# Patient Record
Sex: Female | Born: 1965 | Race: White | Hispanic: No | Marital: Single | Smoking: Current every day smoker
Health system: Southern US, Community
[De-identification: ages and names within clinical notes are randomized; demographics above are authoritative.]

## PROBLEM LIST (undated history)

## (undated) DIAGNOSIS — F32A Depression, unspecified: Secondary | ICD-10-CM

## (undated) DIAGNOSIS — F329 Major depressive disorder, single episode, unspecified: Secondary | ICD-10-CM

## (undated) DIAGNOSIS — J449 Chronic obstructive pulmonary disease, unspecified: Secondary | ICD-10-CM

---

## 1998-07-06 ENCOUNTER — Ambulatory Visit (HOSPITAL_COMMUNITY): Admission: RE | Admit: 1998-07-06 | Discharge: 1998-07-06 | Payer: Self-pay | Admitting: Gastroenterology

## 2002-08-04 ENCOUNTER — Ambulatory Visit (HOSPITAL_COMMUNITY): Admission: RE | Admit: 2002-08-04 | Discharge: 2002-08-04 | Payer: Self-pay | Admitting: Gastroenterology

## 2011-06-30 ENCOUNTER — Inpatient Hospital Stay (HOSPITAL_COMMUNITY)
Admission: EM | Admit: 2011-06-30 | Discharge: 2011-07-01 | DRG: 603 | Disposition: A | Discharge status: Home / Self Care | Payer: Self-pay | Attending: Internal Medicine | Admitting: Internal Medicine

## 2011-06-30 DIAGNOSIS — IMO0002 Reserved for concepts with insufficient information to code with codable children: Principal | ICD-10-CM | POA: Diagnosis present

## 2011-06-30 DIAGNOSIS — F3289 Other specified depressive episodes: Secondary | ICD-10-CM | POA: Diagnosis present

## 2011-06-30 DIAGNOSIS — R51 Headache: Secondary | ICD-10-CM | POA: Diagnosis present

## 2011-06-30 DIAGNOSIS — E876 Hypokalemia: Secondary | ICD-10-CM | POA: Diagnosis present

## 2011-06-30 DIAGNOSIS — F329 Major depressive disorder, single episode, unspecified: Secondary | ICD-10-CM | POA: Diagnosis present

## 2011-06-30 DIAGNOSIS — D72829 Elevated white blood cell count, unspecified: Secondary | ICD-10-CM | POA: Diagnosis present

## 2011-06-30 DIAGNOSIS — F17200 Nicotine dependence, unspecified, uncomplicated: Secondary | ICD-10-CM | POA: Diagnosis present

## 2011-06-30 DIAGNOSIS — IMO0001 Reserved for inherently not codable concepts without codable children: Secondary | ICD-10-CM | POA: Diagnosis present

## 2011-07-01 LAB — COMPREHENSIVE METABOLIC PANEL
ALT: <5 U/L (ref 0–35)
AST: 6 U/L (ref 0–37)
Calcium: 8.2 mg/dL — ABNORMAL LOW (ref 8.4–10.5)
Creatinine, Ser: 0.55 mg/dL (ref 0.50–1.10)
GFR calc Af Amer: >60 mL/min (ref >60)
Sodium: 138 mEq/L (ref 135–145)
Total Protein: 5.6 g/dL — ABNORMAL LOW (ref 6.0–8.3)

## 2011-07-01 LAB — POCT I-STAT, CHEM 8
BUN: 8 mg/dL (ref 6–23)
Chloride: 102 mEq/L (ref 96–112)
Creatinine, Ser: 0.7 mg/dL (ref 0.50–1.10)
Potassium: 2.7 mEq/L — CL (ref 3.5–5.1)
Sodium: 140 mEq/L (ref 135–145)

## 2011-07-01 LAB — RAPID URINE DRUG SCREEN, HOSP PERFORMED
Amphetamines: NOT DETECTED
Benzodiazepines: NOT DETECTED
Cocaine: NOT DETECTED
Opiates: NOT DETECTED
Tetrahydrocannabinol: NOT DETECTED

## 2011-07-01 LAB — DIFFERENTIAL
Basophils Relative: 0 % (ref 0–1)
Monocytes Relative: 7 % (ref 3–12)
Neutro Abs: 14.9 10*3/uL — ABNORMAL HIGH (ref 1.7–7.7)
Neutrophils Relative %: 80 % — ABNORMAL HIGH (ref 43–77)

## 2011-07-01 LAB — CBC
Hemoglobin: 13.7 g/dL (ref 12.0–15.0)
MCH: 32.5 pg (ref 26.0–34.0)
MCH: 32.5 pg (ref 26.0–34.0)
MCHC: 34.1 g/dL (ref 30.0–36.0)
Platelets: 176 10*3/uL (ref 150–400)
RBC: 4.21 MIL/uL (ref 3.87–5.11)
WBC: 18.6 10*3/uL — ABNORMAL HIGH (ref 4.0–10.5)

## 2011-07-02 NOTE — Discharge Summary (Signed)
NAMEJAMICE, CARRENO NO.:  [id number]  MEDICAL RECORD NO.:  [id number]  LOCATION:  5114                         FACILITY:  [facility]  PHYSICIAN:  Andreas Blower, MD       DATE OF BIRTH:  [birth year]  DATE OF ADMISSION:  06/30/2011 DATE OF DISCHARGE:  07/01/2011                              DISCHARGE SUMMARY   PRIMARY CARE PHYSICIAN:  The patient reports that she sees Dr. Lanny Cramp.  DISCHARGE DIAGNOSES: 1. Cellulitis of her right upper extremity from her palm to the elbow     after a cat bite, improved. 2. Tobacco abuse. 3. History of depression. 4. Hypokalemia. 5. Leukocytosis. 6. History of chronic intermittent headaches.  DISCHARGE MEDICATIONS: 1. Augmentin 875 mg p.o. twice daily for 14 days. 2. Calcium over-the-counter one tablet p.o. daily. 3. Lexapro 10 mg daily. 4. Vitamin C over-the-counter one tablet p.o. daily.  BRIEF ADMITTING HISTORY AND PHYSICAL:  Mr. Zylka is a 45 year old Caucasian female with history of depression, tobacco use, who came in after she had a cat bite few days ago with increased swelling and redness in her arm.  RADIOLOGY/IMAGING:  The patient has no radiology imaging.  LABORATORY DATA:  CBC shows a white count of 14.3, hemoglobin 11.4, hematocrit 34.0, platelet count 176.  Electrolytes normal except potassium is 2.0, BUN is 8, creatinine 0.55.  Liver function tests normal except albumin is 2.8, total protein is 5.6.  Urine drug screen negative.  Blood cultures x2 showed no growth to date.  ASSESSMENT AND PLAN: 1. Right upper extremity cellulitis from her palm to the elbow after a     cat bite.  The patient initially was started on Unasyn in the ER;     however, the antibiotics were transitioned to vancomycin and Zosyn.     After IV antibiotics were started, the patient's redness which had     extended in all the way up to the elbow was resolved.  Her     antibiotics were transitioned to Augmentin given the patient  has     reports cat bite.  The patient will take Augmentin for 14 days.  On     discussing with the patient and family, it appears that the cat has     been well taking care of and was unintentional provoked accident     with the bite.  The patient was also instructed that if she starts     having swelling and increasing redness, she is to come back to the     ER for further evaluation. 2. Tobacco abuse.  Again, instructed the patient on smoking cessation;     however, she indicated that she is not interested and would     continue to smoke. 3. Depression, stable.  Continue the patient on home medications at     the time of discharge. 4. Hypokalemia.  The patient was replaced in the ER. 5. Headaches.  The patient was instructed to continue a [employer]     that she has been taking as needed, instructor to take Tylenol if     the patient continues the headache.  She was instructed to follow  with her primary care physician for further evaluation if she has     persistent chronic headaches. 6. Leukocytosis likely due to cellulitis.  Time spent on discharge talking to the patient's family and coordinating care was 25 minutes.  DISPOSITION AND FOLLOWUP:  The patient was instructed to follow up with Dr. Loleta Chance, her primary care physician in 1 week as needed.   Andreas Blower, MD   SR/MEDQ  D:  07/01/2011  T:  07/01/2011  Job:  [id number]  Electronically Signed by Wardell Heath Lucindia Lemley  on 07/02/2011 08:58:02 AM

## 2011-07-07 LAB — CULTURE, BLOOD (ROUTINE X 2)
Culture  Setup Time: 201208140838
Culture: NO GROWTH

## 2011-08-30 NOTE — H&P (Signed)
Rebecca Liu, Rebecca Liu NO.:  [id number]  MEDICAL RECORD NO.:  [id number]  LOCATION:  [facility]                         FACILITY:  [facility]  PHYSICIAN:  Lonia Blood, M.D.      DATE OF BIRTH:  [birth year]  DATE OF ADMISSION:  07/01/2011 DATE OF DISCHARGE:                             HISTORY & PHYSICAL   PRIMARY CARE PHYSICIAN:  She is unassigned to [place].  PRESENTING COMPLAINT:  Right upper extremity swelling and tenderness.  HISTORY OF PRESENT ILLNESS:  The patient is a 45 year old female with history of mainly of depression who came in secondary to a cat bite that happened a few days ago.  Since then, she has had pain in her hand.  She was bit last Sunday on the right hand.  The hand started swelling, but today it extended all the way up the arm.  She has some vomiting and chills.  She decided to come in for further treatment.  She also had fever at home.  No other site was involved.  She is not sure weather thecart was immunized or not.  The patient has had tetanus shot during the last 10 years.  PAST MEDICAL HISTORY:  Significant mainly for depression.  ALLERGIES:  No known drug allergies.  MEDICATIONS:  She takes one medication for depression, she cannot recall.  SOCIAL HISTORY:  [place]  She smokes about one and half packs per days.  No alcohol.  No IV drug use.  FAMILY HISTORY:  No significant family history for hypertension, sepsis, or any immunocompromised status.  REVIEW OF SYSTEMS:  All systems reviewed are negative except per HPI.  PHYSICAL EXAMINATION:  VITAL SIGNS:  Temperature here 98.8 T-max, blood pressure 138/74 with pulse 93, respiratory rate 16, and sats 97% on room air. GENERAL:  She is awake, alert, oriented, pleasant woman who is in no acute distress. HEENT:  PERRL.  EOMI.  No pallor, no jaundice, no rhinorrhea. NECK:  Supple.  No JVD, no lymphadenopathy. RESPIRATORY:  Good air entry bilaterally.  No wheezes,  no rales, no crackles. CARDIOVASCULAR:  S1 and S2.  No audible murmur. ABDOMEN:  Soft, full, nontender with positive bowel sounds. EXTREMITIES:  No edema, cyanosis, or clubbing in the lower extremity. Right upper extremity shows 2 distinct bite marks on the volar aspect of her palm, just below her thumb.  There is also a medial area of erythema and swelling with warmth extending all the way to her axilla.  No discharge is noted and no ulcers noted. SKIN:  As described above. MUSCULOSKELETAL:  No joint swelling or tenderness.  LABORATORY DATA:  White count is 18.6, her hemoglobin 13.7 with platelet of 215.  She has left shift, ANC of 14.9.  Sodium 140, potassium is 2.7, chloride 102, BUN 8, creatinine 0.70, glucose 101, ionized calcium 1.17.  ASSESSMENT:  This is a 45 year old female presenting with cellulitis of her right upper extremity after a cat bite.  She also has hypokalemia probably from the nausea and vomiting she experienced.  She is not septic at this point.  PLAN: 1. Cellulitis:  We will admit the patient for IV antibiotics and also  put as observation.  I will start on vancomycin and Zosyn until she     gets better, then put her on oral probably doxycycline or Septra.     Being that this is cat bite, we need to cover for anaerobic as     well.  That is why we will get her Zosyn.  When she leaves, maybe     clindamycin will be a good choice.  The pain control also be     instituted. 2. Hypokalemia from her nausea and vomiting:  We will replete her     potassium. 3. Tobacco abuse:  I will put her on some nicotine patch and also     tobacco cessation counseling. 4. Depression.  We will obtain her home medication list in the morning     and start her on that.  Further treatment depends on how she     responds to these measures.     Lonia Blood, M.D.     Verlin Grills  D:  07/01/2011  T:  07/01/2011  Job:  [id number]  Electronically Signed by Lonia Blood M.D. on  08/30/2011 02:52:16 PM

## 2011-11-23 ENCOUNTER — Emergency Department (HOSPITAL_COMMUNITY): Payer: Self-pay

## 2011-11-23 ENCOUNTER — Encounter: Payer: Self-pay | Admitting: *Deleted

## 2011-11-23 ENCOUNTER — Emergency Department (HOSPITAL_COMMUNITY)
Admission: EM | Admit: 2011-11-23 | Discharge: 2011-11-23 | Disposition: A | Discharge status: Home / Self Care | Payer: Self-pay | Attending: Emergency Medicine | Admitting: Emergency Medicine

## 2011-11-23 DIAGNOSIS — R079 Chest pain, unspecified: Secondary | ICD-10-CM | POA: Insufficient documentation

## 2011-11-23 DIAGNOSIS — R062 Wheezing: Secondary | ICD-10-CM | POA: Insufficient documentation

## 2011-11-23 DIAGNOSIS — R05 Cough: Secondary | ICD-10-CM | POA: Insufficient documentation

## 2011-11-23 DIAGNOSIS — M546 Pain in thoracic spine: Secondary | ICD-10-CM | POA: Insufficient documentation

## 2011-11-23 DIAGNOSIS — J189 Pneumonia, unspecified organism: Secondary | ICD-10-CM | POA: Insufficient documentation

## 2011-11-23 DIAGNOSIS — R109 Unspecified abdominal pain: Secondary | ICD-10-CM | POA: Insufficient documentation

## 2011-11-23 DIAGNOSIS — R059 Cough, unspecified: Secondary | ICD-10-CM | POA: Insufficient documentation

## 2011-11-23 DIAGNOSIS — Z79899 Other long term (current) drug therapy: Secondary | ICD-10-CM | POA: Insufficient documentation

## 2011-11-23 DIAGNOSIS — R0989 Other specified symptoms and signs involving the circulatory and respiratory systems: Secondary | ICD-10-CM | POA: Insufficient documentation

## 2011-11-23 LAB — D-DIMER, QUANTITATIVE: D-Dimer, Quant: 0.54 ug/mL-FEU — ABNORMAL HIGH (ref 0.00–0.48)

## 2011-11-23 LAB — POCT I-STAT, CHEM 8
BUN: 3 mg/dL — ABNORMAL LOW (ref 6–23)
Calcium, Ion: 1.13 mmol/L (ref 1.12–1.32)
Chloride: 103 mEq/L (ref 96–112)
HCT: 48 % — ABNORMAL HIGH (ref 36.0–46.0)
Potassium: 3.9 mEq/L (ref 3.5–5.1)

## 2011-11-23 MED ORDER — SODIUM CHLORIDE 0.9 % IV SOLN
INTRAVENOUS | Status: DC
  Administered 2011-11-23: 17:00:00 via INTRAVENOUS

## 2011-11-23 MED ORDER — OXYCODONE-ACETAMINOPHEN 5-325 MG PO TABS: 2.0000 | ORAL_TABLET | ORAL | Status: AC | PRN

## 2011-11-23 MED ORDER — PREDNISONE 20 MG PO TABS
60.0000 mg | ORAL_TABLET | Freq: Once | ORAL | Status: AC
  Administered 2011-11-23: 60 mg via ORAL
  Filled 2011-11-23: qty 3

## 2011-11-23 MED ORDER — KETOROLAC TROMETHAMINE 30 MG/ML IJ SOLN
30.0000 mg | Freq: Once | INTRAMUSCULAR | Status: AC
  Administered 2011-11-23: 30 mg via INTRAVENOUS
  Filled 2011-11-23: qty 1

## 2011-11-23 MED ORDER — IOHEXOL 350 MG/ML SOLN
100.0000 mL | Freq: Once | INTRAVENOUS | Status: AC | PRN
  Administered 2011-11-23: 100 mL via INTRAVENOUS

## 2011-11-23 MED ORDER — ALBUTEROL SULFATE HFA 108 (90 BASE) MCG/ACT IN AERS
2.0000 | INHALATION_SPRAY | RESPIRATORY_TRACT | Status: DC | PRN
  Administered 2011-11-23: 2 via RESPIRATORY_TRACT
  Filled 2011-11-23: qty 6.7

## 2011-11-23 MED ORDER — LEVOFLOXACIN 500 MG PO TABS: 500.0000 mg | ORAL_TABLET | Freq: Every day | ORAL | Status: AC

## 2011-11-23 NOTE — ED Notes (Signed)
She has had this for 4 months.  Hoarse cough and she is a smoker.

## 2011-11-23 NOTE — ED Provider Notes (Signed)
History     CSN: [id number]  Arrival date & time 11/23/11  1524   First MD Initiated Contact with Patient 11/23/11 1610      Chief Complaint  Patient presents with  . Rib Injury    (Consider location/radiation/quality/duration/timing/severity/associated sxs/prior treatment) The history is provided by the patient and the spouse.   the patient is a 46 year old, female, with no significant past medical history, who smokes cigarettes.  She presents to the emergency department complaining of right sided pleuritic posterior back pain.  This has been present for about 10 days.  She was seen by another doctor and told that she had pleurisy and placed on multiple medications.  She has not gotten better, so she presented to the emergency department for further evaluation.  She denies cough, shortness of breath, chest pain, nausea, vomiting, fevers, chills, leg pain or swelling.  History reviewed. No pertinent past medical history.  History reviewed. No pertinent past surgical history.  History reviewed. No pertinent family history.  History  Substance Use Topics  . Smoking status: Current Everyday Smoker  . Smokeless tobacco: Not on file  . Alcohol Use: No    OB History    Grav Para Term Preterm Abortions TAB SAB Ect Mult Living                  Review of Systems  Constitutional: Negative for fever and chills.  HENT: Negative for congestion.   Respiratory: Positive for wheezing. Negative for cough and shortness of breath.   Cardiovascular: Negative for chest pain.  Gastrointestinal: Negative for nausea and vomiting.  Genitourinary: Positive for flank pain. Negative for hematuria.  Musculoskeletal: Positive for back pain.  Skin: Negative for rash.  All other systems reviewed and are negative.    Allergies  Review of patient's allergies indicates no known allergies.  Home Medications   Current Outpatient Rx  Name Route Sig Dispense Refill  . BENZONATATE 100 MG PO CAPS Oral  Take 100 mg by mouth 3 (three) times daily as needed. For cough     . CLARITHROMYCIN 500 MG PO TABS Oral Take 500 mg by mouth 2 (two) times daily. For ten days starting 11/14/11     . ESCITALOPRAM OXALATE 10 MG PO TABS Oral Take 5 mg by mouth at bedtime.      . GUAIFENESIN-CODEINE 100-10 MG/5ML PO SYRP Oral Take 5-10 mLs by mouth every 4 (four) hours as needed. For cough     . MELOXICAM 15 MG PO TABS Oral Take 15 mg by mouth daily.      Marland Kitchen PREDNISONE 10 MG PO TABS Oral Take 10 mg by mouth daily. Started on tapered dose starting 11/14/11, currently on one tablet daily       BP 133/98  Pulse 97  Temp(Src) 98.6 F (37 C) (Oral)  Resp 20  SpO2 94%  Physical Exam  Constitutional: She is oriented to person, place, and time. She appears well-developed and well-nourished.  HENT:  Head: Normocephalic and atraumatic.  Eyes: Pupils are equal, round, and reactive to light.  Neck: Normal range of motion.  Cardiovascular: Normal rate, regular rhythm and normal heart sounds.   No murmur heard. Pulmonary/Chest: Effort normal. No respiratory distress. She has wheezes. She has no rales.  Abdominal: Soft. She exhibits no distension and no mass. There is no tenderness. There is no rebound and no guarding.  Musculoskeletal: Normal range of motion. She exhibits no edema and no tenderness.       Tenderness  in the right posterior back, just inferior and medial to the right scapula.  No rash, ecchymoses, or signs of trauma or infection  Neurological: She is alert and oriented to person, place, and time. No cranial nerve deficit.  Skin: Skin is warm and dry. No rash noted. No erythema.  Psychiatric: She has a normal mood and affect. Her behavior is normal.    ED Course  Procedures (including critical care time) 46 year old, female, with pleuritic chest pain for approximately 10 days.  She has no risk factors for a pulmonary embolism, but she has no symptoms consistent with pneumonia.  We will perform a chest  x-ray, and laboratory testing, including a d-dimer, to look for signs of a pulmonary embolism.  Labs Reviewed  D-DIMER, QUANTITATIVE - Abnormal; Notable for the following:    D-Dimer, Quant 0.54 (*)    All other components within normal limits  POCT I-STAT, CHEM 8 - Abnormal; Notable for the following:    BUN 3 (*)    Glucose, Bld 102 (*)    Hemoglobin 16.3 (*)    HCT 48.0 (*)    All other components within normal limits  I-STAT, CHEM 8   Dg Chest 2 View  11/23/2011  *RADIOLOGY REPORT*  Clinical Data: Cough, congestion  CHEST - 2 VIEW  Comparison: None.  Findings: Chronic interstitial markings.  Mild superimposed patchy opacity in the right upper lobe, possibly infectious. No pleural effusion or pneumothorax.  Cardiomediastinal silhouette is within normal limits.  Degenerative changes of the visualized thoracolumbar spine.  IMPRESSION: Chronic interstitial markings.  Mild superimposed patchy opacity in the right upper lobe, possibly infectious.  Original Report Authenticated By: Charline Bills, M.D.     No diagnosis found.    MDM  Suspected persistent pneumonia No hypoxia or respiratory distress No pulmonary embolism        Nicholes Stairs, MD 11/23/11 2147

## 2011-11-23 NOTE — ED Notes (Signed)
C/o rt sided lower lateral rib pain for 10 days.  She has been seen at the urgent care x 4 for the same and she says they told her they could not do anything else for here and sent her down.  She has been diagnosed with pleurisy

## 2011-11-23 NOTE — ED Notes (Signed)
Patient transported to X-ray 

## 2011-11-23 NOTE — ED Notes (Signed)
Pt c/o sob, productive cough w/yellow colored sputum, and rib pain d/t coughing. Pt seen at [facility] x4 for the same and prescribed numerous abx w/o any relief, they sent pt to ed today for further evaluation.

## 2015-02-23 ENCOUNTER — Emergency Department (HOSPITAL_COMMUNITY): Payer: Self-pay

## 2015-02-23 ENCOUNTER — Encounter (HOSPITAL_COMMUNITY): Payer: Self-pay | Admitting: *Deleted

## 2015-02-23 ENCOUNTER — Emergency Department (HOSPITAL_COMMUNITY)
Admission: EM | Admit: 2015-02-23 | Discharge: 2015-02-23 | Disposition: A | Discharge status: Home / Self Care | Payer: Self-pay | Attending: Emergency Medicine | Admitting: Emergency Medicine

## 2015-02-23 DIAGNOSIS — R531 Weakness: Secondary | ICD-10-CM | POA: Insufficient documentation

## 2015-02-23 DIAGNOSIS — R5383 Other fatigue: Secondary | ICD-10-CM | POA: Insufficient documentation

## 2015-02-23 DIAGNOSIS — Z7952 Long term (current) use of systemic steroids: Secondary | ICD-10-CM | POA: Insufficient documentation

## 2015-02-23 DIAGNOSIS — Z79899 Other long term (current) drug therapy: Secondary | ICD-10-CM | POA: Insufficient documentation

## 2015-02-23 DIAGNOSIS — Z720 Tobacco use: Secondary | ICD-10-CM | POA: Insufficient documentation

## 2015-02-23 DIAGNOSIS — G8929 Other chronic pain: Secondary | ICD-10-CM

## 2015-02-23 DIAGNOSIS — J441 Chronic obstructive pulmonary disease with (acute) exacerbation: Secondary | ICD-10-CM | POA: Insufficient documentation

## 2015-02-23 DIAGNOSIS — Z791 Long term (current) use of non-steroidal anti-inflammatories (NSAID): Secondary | ICD-10-CM | POA: Insufficient documentation

## 2015-02-23 DIAGNOSIS — R0602 Shortness of breath: Secondary | ICD-10-CM

## 2015-02-23 DIAGNOSIS — R51 Headache: Secondary | ICD-10-CM | POA: Insufficient documentation

## 2015-02-23 DIAGNOSIS — R5382 Chronic fatigue, unspecified: Secondary | ICD-10-CM

## 2015-02-23 HISTORY — DX: Major depressive disorder, single episode, unspecified: F32.9

## 2015-02-23 HISTORY — DX: Depression, unspecified: F32.A

## 2015-02-23 HISTORY — DX: Chronic obstructive pulmonary disease, unspecified: J44.9

## 2015-02-23 LAB — BASIC METABOLIC PANEL
Anion gap: 9 (ref 5–15)
BUN: 15 mg/dL (ref 6–23)
CALCIUM: 9.2 mg/dL (ref 8.4–10.5)
CO2: 26 mmol/L (ref 19–32)
CREATININE: 0.8 mg/dL (ref 0.50–1.10)
Chloride: 108 mmol/L (ref 96–112)
GFR calc Af Amer: >90 mL/min (ref >90)
GFR calc non Af Amer: 86 mL/min — ABNORMAL LOW (ref >90)
GLUCOSE: 109 mg/dL — AB (ref 70–99)
Potassium: 3.9 mmol/L (ref 3.5–5.1)
SODIUM: 143 mmol/L (ref 135–145)

## 2015-02-23 LAB — I-STAT TROPONIN, ED: TROPONIN I, POC: 0 ng/mL (ref 0.00–0.08)

## 2015-02-23 LAB — CBC
HEMATOCRIT: 41 % (ref 36.0–46.0)
Hemoglobin: 13.6 g/dL (ref 12.0–15.0)
MCH: 32.1 pg (ref 26.0–34.0)
MCHC: 33.2 g/dL (ref 30.0–36.0)
MCV: 96.7 fL (ref 78.0–100.0)
Platelets: 261 10*3/uL (ref 150–400)
RBC: 4.24 MIL/uL (ref 3.87–5.11)
RDW: 14.7 % (ref 11.5–15.5)
WBC: 5.2 10*3/uL (ref 4.0–10.5)

## 2015-02-23 MED ORDER — PREDNISONE 20 MG PO TABS: ORAL_TABLET | ORAL | Status: DC

## 2015-02-23 MED ORDER — IPRATROPIUM-ALBUTEROL 0.5-2.5 (3) MG/3ML IN SOLN
3.0000 mL | RESPIRATORY_TRACT | Status: DC
  Administered 2015-02-23: 3 mL via RESPIRATORY_TRACT
  Filled 2015-02-23: qty 3

## 2015-02-23 MED ORDER — PREDNISONE 20 MG PO TABS
60.0000 mg | ORAL_TABLET | Freq: Once | ORAL | Status: AC
  Administered 2015-02-23: 60 mg via ORAL
  Filled 2015-02-23: qty 3

## 2015-02-23 MED ORDER — AZITHROMYCIN 250 MG PO TABS: ORAL_TABLET | ORAL | Status: DC

## 2015-02-23 NOTE — ED Notes (Signed)
Pt reports having sob x 2 days with non productive cough. Also reports blurred vision for extended amount of time and "shaking" of her hands which is making it difficult to grip objects.

## 2015-02-23 NOTE — Discharge Instructions (Signed)
Continue your home inhalers. Use prednisone as directed. Take antibiotic until finished. Follow up with the neurologist listed above to have ongoing care of your unsteady gait and weakness. Follow up with your regular doctor in 1 week for recheck of symptoms. Return to the ER for changes or worsening symptoms.   Chronic Obstructive Pulmonary Disease Exacerbation Chronic obstructive pulmonary disease (COPD) is a common lung condition in which airflow from the lungs is limited. COPD is a general term that can be used to describe many different lung problems that limit airflow, including chronic bronchitis and emphysema. COPD exacerbations are episodes when breathing symptoms become much worse and require extra treatment. Without treatment, COPD exacerbations can be life threatening, and frequent COPD exacerbations can cause further damage to your lungs. CAUSES   Respiratory infections.   Exposure to smoke.   Exposure to air pollution, chemical fumes, or dust. Sometimes there is no apparent cause or trigger. RISK FACTORS  Smoking cigarettes.  Older age.  Frequent prior COPD exacerbations. SIGNS AND SYMPTOMS   Increased coughing.   Increased thick spit (sputum) production.   Increased wheezing.   Increased shortness of breath.   Rapid breathing.   Chest tightness. DIAGNOSIS  Your medical history, a physical exam, and tests will help your health care provider make a diagnosis. Tests may include:  A chest X-ray.  Basic lab tests.  Sputum testing.  An arterial blood gas test. TREATMENT  Depending on the severity of your COPD exacerbation, you may need to be admitted to a hospital for treatment. Some of the treatments commonly used to treat COPD exacerbations are:   Antibiotic medicines.   Bronchodilators. These are drugs that expand the air passages. They may be given with an inhaler or nebulizer. Spacer devices may be needed to help improve drug  delivery.  Corticosteroid medicines.  Supplemental oxygen therapy.  HOME CARE INSTRUCTIONS   Do not smoke. Quitting smoking is very important to prevent COPD from getting worse and exacerbations from happening as often.  Avoid exposure to all substances that irritate the airway, especially to tobacco smoke.   If you were prescribed an antibiotic medicine, finish it all even if you start to feel better.  Take all medicines as directed by your health care provider.It is important to use correct technique with inhaled medicines.  Drink enough fluids to keep your urine clear or pale yellow (unless you have a medical condition that requires fluid restriction).  Use a cool mist vaporizer. This makes it easier to clear your chest when you cough.   If you have a home nebulizer and oxygen, continue to use them as directed.   Maintain all necessary vaccinations to prevent infections.   Exercise regularly.   Eat a healthy diet.   Keep all follow-up appointments as directed by your health care provider. SEEK IMMEDIATE MEDICAL CARE IF:  You have worsening shortness of breath.   You have trouble talking.   You have severe chest pain.  You have blood in your sputum.  You have a fever.  You have weakness, vomit repeatedly, or faint.   You feel confused.   You continue to get worse. MAKE SURE YOU:   Understand these instructions.  Will watch your condition.  Will get help right away if you are not doing well or get worse. Document Released: 08/31/2007 Document Revised: 03/20/2014 Document Reviewed: 07/08/2013 [facility] Patient Information 2015 [place], [place]. This information is not intended to replace advice given to you by your health care provider.  Make sure you discuss any questions you have with your health care provider.  Fatigue Fatigue is a feeling of tiredness, lack of energy, lack of motivation, or feeling tired all the time. Having enough rest, good  nutrition, and reducing stress will normally reduce fatigue. Consult your caregiver if it persists. The nature of your fatigue will help your caregiver to find out its cause. The treatment is based on the cause.  CAUSES  There are many causes for fatigue. Most of the time, fatigue can be traced to one or more of your habits or routines. Most causes fit into one or more of three general areas. They are: Lifestyle problems  Sleep disturbances.  Overwork.  Physical exertion.  Unhealthy habits.  Poor eating habits or eating disorders.  Alcohol and/or drug use .  Lack of proper nutrition (malnutrition). Psychological problems  Stress and/or anxiety problems.  Depression.  Grief.  Boredom. Medical Problems or Conditions  Anemia.  Pregnancy.  Thyroid gland problems.  Recovery from major surgery.  Continuous pain.  Emphysema or asthma that is not well controlled  Allergic conditions.  Diabetes.  Infections (such as mononucleosis).  Obesity.  Sleep disorders, such as sleep apnea.  Heart failure or other heart-related problems.  Cancer.  Kidney disease.  Liver disease.  Effects of certain medicines such as antihistamines, cough and cold remedies, prescription pain medicines, heart and blood pressure medicines, drugs used for treatment of cancer, and some antidepressants. SYMPTOMS  The symptoms of fatigue include:   Lack of energy.  Lack of drive (motivation).  Drowsiness.  Feeling of indifference to the surroundings. DIAGNOSIS  The details of how you feel help guide your caregiver in finding out what is causing the fatigue. You will be asked about your present and past health condition. It is important to review all medicines that you take, including prescription and non-prescription items. A thorough exam will be done. You will be questioned about your feelings, habits, and normal lifestyle. Your caregiver may suggest blood tests, urine tests, or other  tests to look for common medical causes of fatigue.  TREATMENT  Fatigue is treated by correcting the underlying cause. For example, if you have continuous pain or depression, treating these causes will improve how you feel. Similarly, adjusting the dose of certain medicines will help in reducing fatigue.  HOME CARE INSTRUCTIONS   Try to get the required amount of good sleep every night.  Eat a healthy and nutritious diet, and drink enough water throughout the day.  Practice ways of relaxing (including yoga or meditation).  Exercise regularly.  Make plans to change situations that cause stress. Act on those plans so that stresses decrease over time. Keep your work and personal routine reasonable.  Avoid Joan Avetisyan drugs and minimize use of alcohol.  Start taking a daily multivitamin after consulting your caregiver. SEEK MEDICAL CARE IF:   You have persistent tiredness, which cannot be accounted for.  You have fever.  You have unintentional weight loss.  You have headaches.  You have disturbed sleep throughout the night.  You are feeling sad.  You have constipation.  You have dry skin.  You have gained weight.  You are taking any new or different medicines that you suspect are causing fatigue.  You are unable to sleep at night.  You develop any unusual swelling of your legs or other parts of your body. SEEK IMMEDIATE MEDICAL CARE IF:   You are feeling confused.  Your vision is blurred.  You feel faint  or pass out.  You develop severe headache.  You develop severe abdominal, pelvic, or back pain.  You develop chest pain, shortness of breath, or an irregular or fast heartbeat.  You are unable to pass a normal amount of urine.  You develop abnormal bleeding such as bleeding from the rectum or you vomit blood.  You have thoughts about harming yourself or committing suicide.  You are worried that you might harm someone else. MAKE SURE YOU:   Understand these  instructions.  Will watch your condition.  Will get help right away if you are not doing well or get worse. Document Released: 08/31/2007 Document Revised: 01/26/2012 Document Reviewed: 03/07/2014 [facility] Patient Information 2015 [place]. This information is not intended to replace advice given to you by your health care provider. Make sure you discuss any questions you have with your health care provider.

## 2015-02-23 NOTE — ED Provider Notes (Signed)
CSN: [id number]     Arrival date & time 02/23/15  1411 History   First MD Initiated Contact with Patient 02/23/15 1632     Chief Complaint  Patient presents with  . Shortness of Breath     (Consider location/radiation/quality/duration/timing/severity/associated sxs/prior Treatment) HPI Comments: Rebecca Liu is a 49 y.o. female with a PMHx of COPD, GERD, and depression, who presents to the ED with multiple complaints including shortness of breath and nonproductive cough 2 days, as well as "shaking" and "hazy vision" since August of last year. She has been seen by her primary care doctor and ophthalmology for her symptoms. Her boyfriend at bedside reports that over the last 2 days she is then "dropping things" with both hands and seemed unsteady on her feet, although the symptoms have resolved entirely upon arrival in the ER. Patient also reports a chronic headache in the posterior aspect of her head, which she states is "always there" and is no different today than any other day. She denies any fevers, chills, neck pain or stiffness, chest pain, abdominal pain, nausea, vomiting, diarrhea, constipation, melena, hematochezia, dysuria, hematuria, vaginal bleeding or discharge, rashes, arthralgias, myalgias, lightheadedness, dizziness, photophobia, alcohol use, or IV drug use. The patient's boyfriend reports that she takes a quarter of her Xanax dose because it affects her greatly, and that she last took it last night, but he admits that this may be contributing to her slowed response to questioning. He states there are no new medications in the last several months.  Patient is a 49 y.o. female presenting with shortness of breath. The history is provided by the patient. No language interpreter was used.  Shortness of Breath Severity:  Moderate Onset quality:  Gradual Duration:  2 days Timing:  Constant Progression:  Unchanged Chronicity:  Recurrent Relieved by:  None tried Worsened by:  Nothing  tried Ineffective treatments:  None tried Associated symptoms: cough (nonproductive) and headaches (chronic, unchanged)   Associated symptoms: no abdominal pain, no chest pain, no claudication, no fever, no hemoptysis, no neck pain, no rash, no sputum production, no syncope, no vomiting and no wheezing   Risk factors: tobacco use     Past Medical History  Diagnosis Date  . Depression   . COPD (chronic obstructive pulmonary disease)    History reviewed. No pertinent past surgical history. History reviewed. No pertinent family history. History  Substance Use Topics  . Smoking status: Current Every Day Smoker  . Smokeless tobacco: Not on file  . Alcohol Use: No   OB History    No data available     Review of Systems  Constitutional: Positive for fatigue. Negative for fever and chills.  Eyes: Positive for visual disturbance ("hazy" but not at this time). Negative for photophobia.  Respiratory: Positive for cough (nonproductive) and shortness of breath. Negative for hemoptysis, sputum production and wheezing.   Cardiovascular: Negative for chest pain, claudication, leg swelling and syncope.  Gastrointestinal: Negative for nausea, vomiting, abdominal pain, diarrhea, constipation and blood in stool.  Genitourinary: Negative for dysuria, hematuria, vaginal bleeding and vaginal discharge.  Musculoskeletal: Negative for myalgias, arthralgias, neck pain and neck stiffness.  Skin: Negative for color change and rash.  Neurological: Positive for weakness (intermittent decreased grip strength, not currently ongoing) and headaches (chronic, unchanged). Negative for dizziness, tremors, syncope, facial asymmetry, light-headedness and numbness.  Psychiatric/Behavioral: Negative for confusion.   10 Systems reviewed and are negative for acute change except as noted in the HPI.    Allergies  Wellbutrin  Home Medications   Prior to Admission medications   Medication Sig Start Date End Date  Taking? Authorizing Provider  benzonatate (TESSALON) 100 MG capsule Take 100 mg by mouth 3 (three) times daily as needed. For cough     Historical Provider, MD  clarithromycin (BIAXIN) 500 MG tablet Take 500 mg by mouth 2 (two) times daily. For ten days starting 11/14/11     Historical Provider, MD  escitalopram (LEXAPRO) 10 MG tablet Take 5 mg by mouth at bedtime.      Historical Provider, MD  guaiFENesin-codeine (ROBITUSSIN AC) 100-10 MG/5ML syrup Take 5-10 mLs by mouth every 4 (four) hours as needed. For cough     Historical Provider, MD  meloxicam (MOBIC) 15 MG tablet Take 15 mg by mouth daily.      Historical Provider, MD  predniSONE (DELTASONE) 10 MG tablet Take 10 mg by mouth daily. Started on tapered dose starting 11/14/11, currently on one tablet daily     Historical Provider, MD   BP 124/75 mmHg  Pulse 77  Temp(Src) 98 F (36.7 C) (Oral)  Resp 17  Ht  (1.6 m)  Wt 147 lb (66.679 kg)  BMI 26.05 kg/m2  SpO2 99% Physical Exam  Constitutional: She is oriented to person, place, and time. Vital signs are normal. She appears well-developed and well-nourished.  Non-toxic appearance. No distress.  Afebrile, nontoxic, NAD, appears fatigued  HENT:  Head: Normocephalic and atraumatic.  Mouth/Throat: Uvula is midline, oropharynx is clear and moist and mucous membranes are normal.  No facial asymmetry, uvula midline, tongue protrusion symmetric  Eyes: Conjunctivae and EOM are normal. Pupils are equal, round, and reactive to light. Right eye exhibits no discharge. Left eye exhibits no discharge.  PERRL, EOMI, no nystagmus, no visual field deficits   Neck: Normal range of motion. Neck supple. No spinous process tenderness and no muscular tenderness present. No rigidity. Normal range of motion present.  FROM intact without spinous process or paraspinous muscle TTP, no bony stepoffs or deformities, no muscle spasms. No rigidity or meningeal signs  Cardiovascular: Normal rate, regular rhythm,  normal heart sounds and intact distal pulses.  Exam reveals no gallop and no friction rub.   No murmur heard. Pulmonary/Chest: Effort normal. No respiratory distress. She has no decreased breath sounds. She has wheezes. She has no rhonchi. She has no rales.  Mild wheezing, no rhonchi or rales, no hypoxia or increased WOB, speaking in full sentences, SpO2 99% on RA   Abdominal: Soft. Normal appearance and bowel sounds are normal. She exhibits no distension. There is no tenderness. There is no rigidity, no rebound, no guarding, no CVA tenderness, no tenderness at McBurney's point and negative Murphy's sign.  Musculoskeletal: Normal range of motion.  MAE x4 Strength and sensation grossly intact Distal pulses intact No pedal edema Gait steady  Neurological: She is alert and oriented to person, place, and time. She has normal strength. No sensory deficit. She displays a negative Romberg sign. Coordination and gait normal.  CN 2-12 grossly intact A&O x4 GCS 15 Sensation and strength intact Gait nonataxic including with tandem walking Coordination with finger-to-nose WNL Neg romberg, neg pronator drift   Skin: Skin is warm, dry and intact. No rash noted.  Psychiatric: She has a normal mood and affect.  Nursing note and vitals reviewed.   ED Course  Procedures (including critical care time) Labs Review Labs Reviewed  BASIC METABOLIC PANEL - Abnormal; Notable for the following:    Glucose, Bld 109 (*)  GFR calc non Af Amer 86 (*)    All other components within normal limits  CBC  I-STAT TROPOININ, ED    Imaging Review Dg Chest 2 View  02/23/2015   CLINICAL DATA:  Shortness of breath, cough and weakness for 3 days.  EXAM: CHEST  2 VIEW  COMPARISON:  CT chest and PA and lateral chest 11/23/2011.  FINDINGS: The lungs are emphysematous. Biapical scarring appears unchanged. No consolidative process, pneumothorax or effusion is identified. Heart size is normal. No focal bony abnormality is  seen.  IMPRESSION: Emphysema without acute disease.   Electronically Signed   By: Drusilla Kanner M.D.   On: 02/23/2015 15:37     EKG Interpretation None     ED ECG REPORT   Date: 02/23/2015  Rate: 84  Rhythm: normal sinus rhythm  QRS Axis: normal  Intervals: normal  ST/T Wave abnormalities: normal  Conduction Disutrbances:none  Narrative Interpretation:   Old EKG Reviewed: none available  I have personally reviewed the EKG tracing and agree with the computerized printout as noted.   MDM   Final diagnoses:  SOB (shortness of breath)  COPD exacerbation  Chronic fatigue  Chronic nonintractable headache, unspecified headache type    49 y.o. female here with SOB x2 days and nonproductive cough. Boyfriend also reporting that she has been shaky and has intermittent weakness in her hands bilaterally, although this is improved at this time, and pt agrees that she doesn't feel shaky or weak. Reports ongoing intermittent hazy vision, although resolved at this time. Has chronic headaches, same as her usual, and without thunderclap onset or worsening today. On exam, nonfocal neuro exam, no deficits noted. Pt admits that she takes xanax, and that this causes her to be very drowsy, but the boyfriend states that her symptoms began before she was taking this medication. Minimal wheezing. Will give prednisone and breathing treatment here for her COPD exacerbation. Will likely refer pt to neurologist for outpt MRI, since no acute findings found here today. Trop neg, CBC WNL, BMP WNL. CXR with emphysematous changes without acute disease. EKG unremarkable. Will reassess after breathing treatment.  5:51 PM Breathing improved after nebs. Will send home with prednisone and azithromycin. Will have her use home inhalers. Will have her f/up with PCP in 1wk, and with neurologist this week. Has opthalmology appt this week. I explained the diagnosis and have given explicit precautions to return to the ER  including for any other new or worsening symptoms. The patient understands and accepts the medical plan as it's been dictated and I have answered their questions. Discharge instructions concerning home care and prescriptions have been given. The patient is STABLE and is discharged to home in good condition.  BP 124/75 mmHg  Pulse 77  Temp(Src) 98 F (36.7 C) (Oral)  Resp 17  Ht 5\' 3"  (1.6 m)  Wt 147 lb (66.679 kg)  BMI 26.05 kg/m2  SpO2 99%  Meds ordered this encounter  Medications  . ipratropium-albuterol (DUONEB) 0.5-2.5 (3) MG/3ML nebulizer solution 3 mL    Sig:   . predniSONE (DELTASONE) tablet 60 mg    Sig:   . predniSONE (DELTASONE) 20 MG tablet    Sig: 3 tabs po daily x 3 days    Dispense:  9 tablet    Refill:  0    Order Specific Question:  Supervising Provider    Answer:  MILLER, BRIAN [[id number]]  . azithromycin (ZITHROMAX Z-PAK) 250 MG tablet    Sig: 2 po day one,  then 1 daily x 4 days    Dispense:  5 tablet    Refill:  0    Order Specific Question:  Supervising Provider    Answer:  Eber Hong [[id number]]     Keely Drennan Camprubi-Soms, PA-C 02/23/15 1752  Blane Ohara, MD 02/23/15 1757

## 2019-08-19 ENCOUNTER — Encounter (HOSPITAL_COMMUNITY): Payer: Self-pay | Admitting: *Deleted

## 2019-08-19 ENCOUNTER — Other Ambulatory Visit: Payer: Self-pay

## 2019-08-19 ENCOUNTER — Emergency Department (HOSPITAL_COMMUNITY)
Admission: EM | Admit: 2019-08-19 | Discharge: 2019-08-20 | Discharge status: Against Medical Advice | Payer: Medicaid Other | Source: Home / Self Care

## 2019-08-19 DIAGNOSIS — Z5321 Procedure and treatment not carried out due to patient leaving prior to being seen by health care provider: Secondary | ICD-10-CM | POA: Insufficient documentation

## 2019-08-19 LAB — CBC
HCT: 42.5 % (ref 36.0–46.0)
Hemoglobin: 15.9 g/dL — ABNORMAL HIGH (ref 12.0–15.0)
MCH: 32.8 pg (ref 26.0–34.0)
MCHC: 37.4 g/dL — ABNORMAL HIGH (ref 30.0–36.0)
MCV: 87.6 fL (ref 80.0–100.0)
Platelets: 418 10*3/uL — ABNORMAL HIGH (ref 150–400)
RBC: 4.85 MIL/uL (ref 3.87–5.11)
RDW: 13 % (ref 11.5–15.5)
WBC: 13.3 10*3/uL — ABNORMAL HIGH (ref 4.0–10.5)
nRBC: 0 % (ref 0.0–0.2)

## 2019-08-19 LAB — URINALYSIS, ROUTINE W REFLEX MICROSCOPIC
Glucose, UA: NEGATIVE mg/dL
Hgb urine dipstick: NEGATIVE
Ketones, ur: 80 mg/dL — AB
Nitrite: NEGATIVE
Protein, ur: 100 mg/dL — AB
Specific Gravity, Urine: 1.026 (ref 1.005–1.030)
pH: 5 (ref 5.0–8.0)

## 2019-08-19 LAB — LIPASE, BLOOD: Lipase: 26 U/L (ref 11–51)

## 2019-08-19 LAB — COMPREHENSIVE METABOLIC PANEL
ALT: 11 U/L (ref 0–44)
AST: 13 U/L — ABNORMAL LOW (ref 15–41)
Albumin: 4.5 g/dL (ref 3.5–5.0)
Alkaline Phosphatase: 105 U/L (ref 38–126)
Anion gap: 20 — ABNORMAL HIGH (ref 5–15)
BUN: 7 mg/dL (ref 6–20)
CO2: 26 mmol/L (ref 22–32)
Calcium: 9.4 mg/dL (ref 8.9–10.3)
Chloride: 68 mmol/L — ABNORMAL LOW (ref 98–111)
Creatinine, Ser: 0.73 mg/dL (ref 0.44–1.00)
GFR calc Af Amer: >60 mL/min (ref >60)
GFR calc non Af Amer: >60 mL/min (ref >60)
Glucose, Bld: 138 mg/dL — ABNORMAL HIGH (ref 70–99)
Potassium: 2.4 mmol/L — CL (ref 3.5–5.1)
Sodium: 114 mmol/L — CL (ref 135–145)
Total Bilirubin: 0.9 mg/dL (ref 0.3–1.2)
Total Protein: 7.5 g/dL (ref 6.5–8.1)

## 2019-08-19 MED ORDER — ONDANSETRON 4 MG PO TBDP
8.0000 mg | ORAL_TABLET | Freq: Once | ORAL | Status: AC
  Administered 2019-08-19: 23:00:00 8 mg via ORAL
  Filled 2019-08-19: qty 2

## 2019-08-19 MED ORDER — SODIUM CHLORIDE 0.9% FLUSH: 3.0000 mL | Freq: Once | INTRAVENOUS | Status: DC

## 2019-08-19 NOTE — ED Notes (Signed)
Abnormal labs

## 2019-08-19 NOTE — ED Notes (Signed)
Pt stated that she cannot wait. This tech advised pt to stay. Pt leaving.

## 2019-08-21 ENCOUNTER — Other Ambulatory Visit: Payer: Self-pay

## 2019-08-21 ENCOUNTER — Emergency Department (HOSPITAL_COMMUNITY): Payer: Medicaid Other

## 2019-08-21 ENCOUNTER — Inpatient Hospital Stay (HOSPITAL_COMMUNITY): Payer: Medicaid Other

## 2019-08-21 ENCOUNTER — Inpatient Hospital Stay (HOSPITAL_COMMUNITY)
Admission: EM | Admit: 2019-08-21 | Discharge: 2019-08-25 | DRG: 643 | Disposition: A | Discharge status: Home / Self Care | Payer: Medicaid Other | Attending: Internal Medicine | Admitting: Internal Medicine

## 2019-08-21 ENCOUNTER — Encounter (HOSPITAL_COMMUNITY): Payer: Self-pay

## 2019-08-21 DIAGNOSIS — R739 Hyperglycemia, unspecified: Secondary | ICD-10-CM | POA: Diagnosis present

## 2019-08-21 DIAGNOSIS — E861 Hypovolemia: Secondary | ICD-10-CM | POA: Diagnosis present

## 2019-08-21 DIAGNOSIS — R519 Headache, unspecified: Secondary | ICD-10-CM | POA: Diagnosis not present

## 2019-08-21 DIAGNOSIS — F32A Depression, unspecified: Secondary | ICD-10-CM | POA: Diagnosis present

## 2019-08-21 DIAGNOSIS — I10 Essential (primary) hypertension: Secondary | ICD-10-CM | POA: Diagnosis present

## 2019-08-21 DIAGNOSIS — G8929 Other chronic pain: Secondary | ICD-10-CM | POA: Diagnosis present

## 2019-08-21 DIAGNOSIS — E876 Hypokalemia: Secondary | ICD-10-CM | POA: Diagnosis present

## 2019-08-21 DIAGNOSIS — E222 Syndrome of inappropriate secretion of antidiuretic hormone: Secondary | ICD-10-CM | POA: Diagnosis not present

## 2019-08-21 DIAGNOSIS — E871 Hypo-osmolality and hyponatremia: Secondary | ICD-10-CM | POA: Diagnosis not present

## 2019-08-21 DIAGNOSIS — Y639 Failure in dosage during unspecified surgical and medical care: Secondary | ICD-10-CM | POA: Diagnosis not present

## 2019-08-21 DIAGNOSIS — Z20828 Contact with and (suspected) exposure to other viral communicable diseases: Secondary | ICD-10-CM | POA: Diagnosis present

## 2019-08-21 DIAGNOSIS — F13239 Sedative, hypnotic or anxiolytic dependence with withdrawal, unspecified: Secondary | ICD-10-CM | POA: Diagnosis not present

## 2019-08-21 DIAGNOSIS — G9341 Metabolic encephalopathy: Secondary | ICD-10-CM | POA: Diagnosis present

## 2019-08-21 DIAGNOSIS — F411 Generalized anxiety disorder: Secondary | ICD-10-CM

## 2019-08-21 DIAGNOSIS — Z79899 Other long term (current) drug therapy: Secondary | ICD-10-CM

## 2019-08-21 DIAGNOSIS — T426X6A Underdosing of other antiepileptic and sedative-hypnotic drugs, initial encounter: Secondary | ICD-10-CM | POA: Diagnosis not present

## 2019-08-21 DIAGNOSIS — F17200 Nicotine dependence, unspecified, uncomplicated: Secondary | ICD-10-CM | POA: Diagnosis not present

## 2019-08-21 DIAGNOSIS — T502X5A Adverse effect of carbonic-anhydrase inhibitors, benzothiadiazides and other diuretics, initial encounter: Secondary | ICD-10-CM | POA: Diagnosis present

## 2019-08-21 DIAGNOSIS — R112 Nausea with vomiting, unspecified: Secondary | ICD-10-CM

## 2019-08-21 DIAGNOSIS — F329 Major depressive disorder, single episode, unspecified: Secondary | ICD-10-CM | POA: Diagnosis present

## 2019-08-21 DIAGNOSIS — J449 Chronic obstructive pulmonary disease, unspecified: Secondary | ICD-10-CM

## 2019-08-21 DIAGNOSIS — Z888 Allergy status to other drugs, medicaments and biological substances status: Secondary | ICD-10-CM | POA: Diagnosis not present

## 2019-08-21 DIAGNOSIS — D72829 Elevated white blood cell count, unspecified: Secondary | ICD-10-CM | POA: Diagnosis present

## 2019-08-21 LAB — BASIC METABOLIC PANEL
Anion gap: 14 (ref 5–15)
BUN: 7 mg/dL (ref 6–20)
CO2: 30 mmol/L (ref 22–32)
Calcium: 8.8 mg/dL — ABNORMAL LOW (ref 8.9–10.3)
Chloride: 67 mmol/L — ABNORMAL LOW (ref 98–111)
Creatinine, Ser: 0.48 mg/dL (ref 0.44–1.00)
GFR calc Af Amer: >60 mL/min (ref >60)
GFR calc non Af Amer: >60 mL/min (ref >60)
Glucose, Bld: 145 mg/dL — ABNORMAL HIGH (ref 70–99)
Potassium: 2.3 mmol/L — CL (ref 3.5–5.1)
Sodium: 111 mmol/L — CL (ref 135–145)

## 2019-08-21 LAB — COMPREHENSIVE METABOLIC PANEL
ALT: 12 U/L (ref 0–44)
AST: 14 U/L — ABNORMAL LOW (ref 15–41)
Albumin: 4.1 g/dL (ref 3.5–5.0)
Alkaline Phosphatase: 95 U/L (ref 38–126)
BUN: 6 mg/dL (ref 6–20)
CO2: 34 mmol/L — ABNORMAL HIGH (ref 22–32)
Calcium: 9.6 mg/dL (ref 8.9–10.3)
Chloride: <65 mmol/L — CL (ref 98–111)
Creatinine, Ser: 0.52 mg/dL (ref 0.44–1.00)
GFR calc Af Amer: >60 mL/min (ref >60)
GFR calc non Af Amer: >60 mL/min (ref >60)
Glucose, Bld: 134 mg/dL — ABNORMAL HIGH (ref 70–99)
Potassium: 2.6 mmol/L — CL (ref 3.5–5.1)
Sodium: 112 mmol/L — CL (ref 135–145)
Total Bilirubin: 1 mg/dL (ref 0.3–1.2)
Total Protein: 6.9 g/dL (ref 6.5–8.1)

## 2019-08-21 LAB — DIFFERENTIAL
Abs Immature Granulocytes: 0.09 10*3/uL — ABNORMAL HIGH (ref 0.00–0.07)
Basophils Absolute: 0.1 10*3/uL (ref 0.0–0.1)
Basophils Relative: 0 %
Eosinophils Absolute: 0 10*3/uL (ref 0.0–0.5)
Eosinophils Relative: 0 %
Immature Granulocytes: 1 %
Lymphocytes Relative: 6 %
Lymphs Abs: 0.9 10*3/uL (ref 0.7–4.0)
Monocytes Absolute: 0.9 10*3/uL (ref 0.1–1.0)
Monocytes Relative: 6 %
Neutro Abs: 12.4 10*3/uL — ABNORMAL HIGH (ref 1.7–7.7)
Neutrophils Relative %: 87 %

## 2019-08-21 LAB — BRAIN NATRIURETIC PEPTIDE: B Natriuretic Peptide: 36 pg/mL (ref 0.0–100.0)

## 2019-08-21 LAB — CBC
Hemoglobin: 15.5 g/dL — ABNORMAL HIGH (ref 12.0–15.0)
Platelets: 317 10*3/uL (ref 150–400)
WBC: 14.3 10*3/uL — ABNORMAL HIGH (ref 4.0–10.5)

## 2019-08-21 LAB — SODIUM
Sodium: 113 mmol/L — CL (ref 135–145)
Sodium: 113 mmol/L — CL (ref 135–145)

## 2019-08-21 LAB — HEMOGLOBIN A1C
Hgb A1c MFr Bld: 5.9 % — ABNORMAL HIGH (ref 4.8–5.6)
Mean Plasma Glucose: 122.63 mg/dL

## 2019-08-21 LAB — MRSA PCR SCREENING: MRSA by PCR: NEGATIVE

## 2019-08-21 LAB — LIPASE, BLOOD: Lipase: 30 U/L (ref 11–51)

## 2019-08-21 LAB — POTASSIUM: Potassium: 2.2 mmol/L — CL (ref 3.5–5.1)

## 2019-08-21 LAB — TSH: TSH: 1.264 u[IU]/mL (ref 0.350–4.500)

## 2019-08-21 LAB — OSMOLALITY: Osmolality: 229 mOsm/kg — CL (ref 275–295)

## 2019-08-21 LAB — HIV ANTIBODY (ROUTINE TESTING W REFLEX): HIV Screen 4th Generation wRfx: NONREACTIVE

## 2019-08-21 LAB — MAGNESIUM: Magnesium: 1.3 mg/dL — ABNORMAL LOW (ref 1.7–2.4)

## 2019-08-21 LAB — VITAMIN D 25 HYDROXY (VIT D DEFICIENCY, FRACTURES): Vit D, 25-Hydroxy: 29.32 ng/mL — ABNORMAL LOW (ref 30–100)

## 2019-08-21 MED ORDER — FLUTICASONE-UMECLIDIN-VILANT 100-62.5-25 MCG/INH IN AEPB: 1.0000 | INHALATION_SPRAY | Freq: Every day | RESPIRATORY_TRACT | Status: DC

## 2019-08-21 MED ORDER — ONDANSETRON HCL 4 MG/2ML IJ SOLN
INTRAMUSCULAR | Status: AC
  Administered 2019-08-21: 14:00:00 4 mg
  Filled 2019-08-21: qty 2

## 2019-08-21 MED ORDER — LORAZEPAM 2 MG/ML IJ SOLN
0.5000 mg | Freq: Four times a day (QID) | INTRAMUSCULAR | Status: DC | PRN
  Administered 2019-08-21 – 2019-08-22 (×2): 1 mg via INTRAVENOUS
  Filled 2019-08-21 (×2): qty 1

## 2019-08-21 MED ORDER — ALBUTEROL SULFATE (2.5 MG/3ML) 0.083% IN NEBU: 2.5000 mg | INHALATION_SOLUTION | RESPIRATORY_TRACT | Status: DC

## 2019-08-21 MED ORDER — ALPRAZOLAM 0.5 MG PO TABS: 0.2500 mg | ORAL_TABLET | Freq: Three times a day (TID) | ORAL | Status: DC | PRN

## 2019-08-21 MED ORDER — ONDANSETRON HCL 4 MG/2ML IJ SOLN
4.0000 mg | Freq: Once | INTRAMUSCULAR | Status: AC
  Administered 2019-08-21: 4 mg via INTRAVENOUS
  Filled 2019-08-21: qty 2

## 2019-08-21 MED ORDER — KETOROLAC TROMETHAMINE 30 MG/ML IJ SOLN
30.0000 mg | Freq: Four times a day (QID) | INTRAMUSCULAR | Status: DC | PRN
  Administered 2019-08-22 (×2): 30 mg via INTRAVENOUS
  Filled 2019-08-21 (×2): qty 1

## 2019-08-21 MED ORDER — VENLAFAXINE HCL ER 75 MG PO CP24
75.0000 mg | ORAL_CAPSULE | Freq: Every day | ORAL | Status: DC
  Administered 2019-08-21: 16:00:00 75 mg via ORAL
  Filled 2019-08-21: qty 1

## 2019-08-21 MED ORDER — KETOROLAC TROMETHAMINE 30 MG/ML IJ SOLN
30.0000 mg | Freq: Once | INTRAMUSCULAR | Status: AC
  Administered 2019-08-21: 30 mg via INTRAVENOUS
  Filled 2019-08-21: qty 1

## 2019-08-21 MED ORDER — NICOTINE 21 MG/24HR TD PT24
21.0000 mg | MEDICATED_PATCH | Freq: Every day | TRANSDERMAL | Status: DC
  Administered 2019-08-21 – 2019-08-25 (×5): 21 mg via TRANSDERMAL
  Filled 2019-08-21 (×5): qty 1

## 2019-08-21 MED ORDER — POTASSIUM CHLORIDE 10 MEQ/100ML IV SOLN
10.0000 meq | INTRAVENOUS | Status: AC
  Administered 2019-08-21 – 2019-08-22 (×6): 10 meq via INTRAVENOUS
  Filled 2019-08-21 (×6): qty 100

## 2019-08-21 MED ORDER — ONDANSETRON HCL 4 MG/2ML IJ SOLN: 4.0000 mg | Freq: Four times a day (QID) | INTRAMUSCULAR | Status: DC | PRN

## 2019-08-21 MED ORDER — PANTOPRAZOLE SODIUM 40 MG PO TBEC
40.0000 mg | DELAYED_RELEASE_TABLET | Freq: Every day | ORAL | Status: DC
  Administered 2019-08-21 – 2019-08-25 (×5): 40 mg via ORAL
  Filled 2019-08-21 (×5): qty 1

## 2019-08-21 MED ORDER — UMECLIDINIUM BROMIDE 62.5 MCG/INH IN AEPB
1.0000 | INHALATION_SPRAY | Freq: Every day | RESPIRATORY_TRACT | Status: DC
  Administered 2019-08-22 – 2019-08-24 (×3): 1 via RESPIRATORY_TRACT
  Filled 2019-08-21: qty 7

## 2019-08-21 MED ORDER — SODIUM CHLORIDE 0.9 % IV BOLUS
1000.0000 mL | Freq: Once | INTRAVENOUS | Status: AC
  Administered 2019-08-21: 1000 mL via INTRAVENOUS

## 2019-08-21 MED ORDER — PROMETHAZINE HCL 12.5 MG PO TABS: 12.5000 mg | ORAL_TABLET | Freq: Four times a day (QID) | ORAL | Status: DC | PRN

## 2019-08-21 MED ORDER — SODIUM CHLORIDE 3 % IV SOLN
INTRAVENOUS | Status: DC
  Administered 2019-08-21: 16:00:00 15 mL/h via INTRAVENOUS
  Filled 2019-08-21 (×2): qty 500

## 2019-08-21 MED ORDER — ACETAMINOPHEN 325 MG PO TABS
650.0000 mg | ORAL_TABLET | Freq: Four times a day (QID) | ORAL | Status: DC | PRN
  Administered 2019-08-24: 05:00:00 650 mg via ORAL
  Filled 2019-08-21: qty 2

## 2019-08-21 MED ORDER — SODIUM CHLORIDE 0.9% FLUSH: 3.0000 mL | Freq: Once | INTRAVENOUS | Status: DC

## 2019-08-21 MED ORDER — ALBUTEROL SULFATE HFA 108 (90 BASE) MCG/ACT IN AERS
2.0000 | INHALATION_SPRAY | RESPIRATORY_TRACT | Status: DC
  Administered 2019-08-21: 17:00:00 2 via RESPIRATORY_TRACT

## 2019-08-21 MED ORDER — POTASSIUM CHLORIDE 10 MEQ/100ML IV SOLN
10.0000 meq | Freq: Once | INTRAVENOUS | Status: AC
  Administered 2019-08-21: 13:00:00 10 meq via INTRAVENOUS
  Filled 2019-08-21: qty 100

## 2019-08-21 MED ORDER — POTASSIUM CHLORIDE CRYS ER 20 MEQ PO TBCR
40.0000 meq | EXTENDED_RELEASE_TABLET | Freq: Once | ORAL | Status: AC
  Administered 2019-08-21: 16:00:00 40 meq via ORAL
  Filled 2019-08-21: qty 2

## 2019-08-21 MED ORDER — MAGNESIUM SULFATE 4 GM/100ML IV SOLN
4.0000 g | Freq: Once | INTRAVENOUS | Status: AC
  Administered 2019-08-21: 15:00:00 4 g via INTRAVENOUS
  Filled 2019-08-21: qty 100

## 2019-08-21 MED ORDER — POTASSIUM CHLORIDE CRYS ER 20 MEQ PO TBCR
40.0000 meq | EXTENDED_RELEASE_TABLET | Freq: Once | ORAL | Status: AC
  Administered 2019-08-21: 13:00:00 40 meq via ORAL
  Filled 2019-08-21: qty 2

## 2019-08-21 MED ORDER — ALBUTEROL SULFATE HFA 108 (90 BASE) MCG/ACT IN AERS
2.0000 | INHALATION_SPRAY | RESPIRATORY_TRACT | Status: DC
  Filled 2019-08-21 (×2): qty 6.7

## 2019-08-21 MED ORDER — POTASSIUM CHLORIDE 10 MEQ/100ML IV SOLN
10.0000 meq | Freq: Once | INTRAVENOUS | Status: AC
  Administered 2019-08-21: 14:00:00 10 meq via INTRAVENOUS
  Filled 2019-08-21: qty 100

## 2019-08-21 MED ORDER — FLUTICASONE FUROATE-VILANTEROL 100-25 MCG/INH IN AEPB
1.0000 | INHALATION_SPRAY | Freq: Every day | RESPIRATORY_TRACT | Status: DC
  Administered 2019-08-23 – 2019-08-24 (×2): 1 via RESPIRATORY_TRACT
  Filled 2019-08-21: qty 28

## 2019-08-21 MED ORDER — ACETAMINOPHEN 650 MG RE SUPP: 650.0000 mg | Freq: Four times a day (QID) | RECTAL | Status: DC | PRN

## 2019-08-21 MED ORDER — CHLORHEXIDINE GLUCONATE CLOTH 2 % EX PADS
6.0000 | MEDICATED_PAD | Freq: Every day | CUTANEOUS | Status: DC
  Administered 2019-08-21 – 2019-08-25 (×5): 6 via TOPICAL

## 2019-08-21 MED ORDER — ALBUTEROL SULFATE (2.5 MG/3ML) 0.083% IN NEBU
INHALATION_SOLUTION | RESPIRATORY_TRACT | Status: AC
  Filled 2019-08-21: qty 3

## 2019-08-21 MED ORDER — DESMOPRESSIN ACETATE 4 MCG/ML IJ SOLN
1.0000 ug | Freq: Three times a day (TID) | INTRAMUSCULAR | Status: DC
  Administered 2019-08-21 – 2019-08-22 (×3): 1 ug via SUBCUTANEOUS
  Filled 2019-08-21 (×10): qty 0.25

## 2019-08-21 MED ORDER — POTASSIUM CHLORIDE IN NACL 20-0.9 MEQ/L-% IV SOLN
INTRAVENOUS | Status: DC
  Administered 2019-08-21: 15:00:00 via INTRAVENOUS
  Filled 2019-08-21: qty 1000

## 2019-08-21 MED ORDER — ONDANSETRON HCL 4 MG PO TABS: 4.0000 mg | ORAL_TABLET | Freq: Four times a day (QID) | ORAL | Status: DC | PRN

## 2019-08-21 NOTE — ED Notes (Signed)
Pt c/o headache and requesting pain medication. Dr Wynetta Emery notified.

## 2019-08-21 NOTE — ED Notes (Signed)
Date and time results received: 08/21/19 1202 (use smartphrase ".now" to insert current time)  Test: Sodium, Potassium, [place] Critical Value: Sodium 112, Potassium 2.6, Chloride less 65  Name of Provider Notified: Dr Roderic Palau Orders Received? Or Actions Taken?: NA

## 2019-08-21 NOTE — ED Provider Notes (Signed)
[facility] EMERGENCY DEPARTMENT Provider Note   CSN: [id number] Arrival date & time: 08/21/19  1041     History   Chief Complaint Chief Complaint  Patient presents with  . Emesis    HPI Rebecca Liu is a 53 y.o. female.     Patient complains of nausea vomiting.  For 4 days.  Patient has history of COPD and hypertension  The history is provided by the patient. No language interpreter was used.  Emesis Severity:  Moderate Timing:  Constant Number of daily episodes:  4 Quality:  Bilious material Able to tolerate:  Liquids Progression:  Worsening Chronicity:  New Recent urination:  Increased Context: not post-tussive   Associated symptoms: no abdominal pain, no cough, no diarrhea and no headaches     Past Medical History:  Diagnosis Date  . COPD (chronic obstructive pulmonary disease) (HCC)   . Depression     Patient Active Problem List   Diagnosis Date Noted  . Hyponatremia 08/21/2019  . Essential hypertension 08/21/2019  . High risk medication use 08/21/2019  . Depression   . COPD (chronic obstructive pulmonary disease) (HCC)   . Hypokalemia   . Nausea and vomiting   . Current smoker   . Leukocytosis   . GAD (generalized anxiety disorder)     History reviewed. No pertinent surgical history.   OB History   No obstetric history on file.      Home Medications    Prior to Admission medications   Medication Sig Start Date End Date Taking? Authorizing Provider  albuterol (VENTOLIN HFA) 108 (90 Base) MCG/ACT inhaler Inhale 2 puffs into the lungs every 4 (four) hours. 06/14/19  Yes [provider]  ALPRAZolam Prudy Feeler(XANAX) 0.5 MG tablet Take 1 tablet by mouth daily. 08/18/19  Yes [provider]  Fluticasone-Umeclidin-Vilant (TRELEGY ELLIPTA) 100-62.5-25 MCG/INH AEPB Take 1 puff by mouth daily. 07/13/19  Yes [provider]  hydrochlorothiazide (HYDRODIURIL) 25 MG tablet Take 1 tablet by mouth daily. 05/24/19 05/23/20 Yes [provider]  venlafaxine XR (EFFEXOR-XR) 75 MG 24 hr capsule Take 1 capsule by mouth daily. 06/13/19  Yes [provider]    Family History History reviewed. No pertinent family history.  Social History Social History   Tobacco Use  . Smoking status: Current Every Day Smoker  . Smokeless tobacco: Never Used  Substance Use Topics  . Alcohol use: No  . Drug use: No     Allergies   Wellbutrin [bupropion]   Review of Systems Review of Systems  Constitutional: Negative for appetite change and fatigue.  HENT: Negative for congestion, ear discharge and sinus pressure.   Eyes: Negative for discharge.  Respiratory: Negative for cough.   Cardiovascular: Negative for chest pain.  Gastrointestinal: Positive for vomiting. Negative for abdominal pain and diarrhea.  Genitourinary: Negative for frequency and hematuria.  Musculoskeletal: Negative for back pain.  Skin: Negative for rash.  Neurological: Negative for seizures and headaches.  Psychiatric/Behavioral: Negative for hallucinations.     Physical Exam Updated Vital Signs BP 111/86   Pulse 75   Temp 98.4 F (36.9 C) (Oral)   Resp 17   SpO2 92%   Physical Exam Vitals signs and nursing note reviewed.  Constitutional:      Appearance: She is well-developed.  HENT:     Head: Normocephalic.     Mouth/Throat:     Mouth: Mucous membranes are moist.  Eyes:     General: No scleral icterus.    Conjunctiva/sclera: Conjunctivae normal.  Neck:     Musculoskeletal: Neck supple.     Thyroid: No thyromegaly.  Cardiovascular:     Rate and Rhythm: Normal rate and regular rhythm.     Heart sounds: No murmur. No friction rub. No gallop.   Pulmonary:     Breath sounds: No stridor. No wheezing or rales.  Chest:     Chest wall: No tenderness.  Abdominal:     General: There is no distension.     Tenderness: There is no abdominal tenderness. There is no rebound.  Musculoskeletal: Normal range of motion.   Lymphadenopathy:     Cervical: No cervical adenopathy.  Skin:    Findings: No erythema or rash.  Neurological:     Mental Status: She is oriented to person, place, and time.     Motor: No abnormal muscle tone.     Coordination: Coordination normal.  Psychiatric:        Behavior: Behavior normal.      ED Treatments / Results  Labs (all labs ordered are listed, but only abnormal results are displayed) Labs Reviewed  COMPREHENSIVE METABOLIC PANEL - Abnormal; Notable for the following components:      Result Value   Sodium 112 (*)    Potassium 2.6 (*)    Chloride <65 (*)    CO2 34 (*)    Glucose, Bld 134 (*)    AST 14 (*)    All other components within normal limits  CBC - Abnormal; Notable for the following components:   WBC 14.3 (*)    Hemoglobin 15.5 (*)    All other components within normal limits  DIFFERENTIAL - Abnormal; Notable for the following components:   Neutro Abs 12.4 (*)    Abs Immature Granulocytes 0.09 (*)    All other components within normal limits  SARS CORONAVIRUS 2 (TAT 6-24 HRS)  URINE CULTURE  LIPASE, BLOOD  URINALYSIS, ROUTINE W REFLEX MICROSCOPIC  TSH  VITAMIN D 25 HYDROXY (VIT D DEFICIENCY, FRACTURES)  MAGNESIUM  OSMOLALITY  OSMOLALITY, URINE  NA AND K (SODIUM & POTASSIUM), RAND UR    EKG None  Radiology No results found.  Procedures Procedures (including critical care time)  Medications Ordered in ED Medications  sodium chloride flush (NS) 0.9 % injection 3 mL (3 mLs Intravenous Not Given 08/21/19 1049)  potassium chloride 10 mEq in 100 mL IVPB (10 mEq Intravenous New Bag/Given 08/21/19 1247)  potassium chloride 10 mEq in 100 mL IVPB (has no administration in time range)  sodium chloride 0.9 % bolus 1,000 mL (1,000 mLs Intravenous New Bag/Given 08/21/19 1112)  ondansetron (ZOFRAN) injection 4 mg (4 mg Intravenous Given 08/21/19 1113)  ketorolac (TORADOL) 30 MG/ML injection 30 mg (30 mg Intravenous Given 08/21/19 1113)  potassium  chloride SA (KLOR-CON) CR tablet 40 mEq (40 mEq Oral Given 08/21/19 1245)     Initial Impression / Assessment and Plan / ED Course  I have reviewed the triage vital signs and the nursing notes.  Pertinent labs & imaging results that were available during my care of the patient were reviewed by me and considered in my medical decision making (see chart for details).        CRITICAL CARE Performed by: Milton Ferguson Total critical care time: 45 minutes Critical care time was exclusive of separately billable procedures and treating other patients. Critical care was necessary to treat or prevent imminent or life-threatening deterioration. Critical care was time spent personally by me on the following activities: development of treatment plan  with patient and/or surrogate as well as nursing, discussions with consultants, evaluation of patient's response to treatment, examination of patient, obtaining history from patient or surrogate, ordering and performing treatments and interventions, ordering and review of laboratory studies, ordering and review of radiographic studies, pulse oximetry and re-evaluation of patient's condition.  Patient with hyponatremia and hypokalemia.  sHe will be admitted to medicine f Final Clinical Impressions(s) / ED Diagnoses   Final diagnoses:  Hyponatremia    ED Discharge Orders    None       Bethann Berkshire, MD 08/21/19 1319

## 2019-08-21 NOTE — ED Triage Notes (Signed)
Pt brought to ED by [facility] for nausea and vomiting x 4 days. Pt received zofran 4 mg PTA. Pt denies abdominal pain.

## 2019-08-21 NOTE — ED Notes (Signed)
Pt placed on cardiac monitoring.  

## 2019-08-21 NOTE — ED Notes (Signed)
Pt vomited oral potassium. Dr Roderic Palau informed. Orders received for 1 additional run of IV Potassium.

## 2019-08-21 NOTE — H&P (Addendum)
History and Physical  Rebecca Liu HYW:[id number] DOB: [birth year] DOA: 08/21/2019  Referring physician: Roderic Palau MD   PCP: Alfonso Ellis, NP   Chief Complaint: nausea and vomiting   HPI: Rebecca Liu is a 53 y.o. female smoker with COPD, hypertension taking hydrochlorothiazide 25 mg daily, depression and anxiety has been having symptoms of nausea and vomiting for the past several days.  She presented to [facility] emergency department on 08/19/2019 because she was sent due to having abnormal labs.  She ended up leaving without being seen because of the long wait time.  She presented to [facility] ED today complaining of 4 to 5 days of nausea vomiting and malaise.  EMS gave her 4 mg of Zofran prior to arrival.  She denies having abdominal pain.  She denies diarrhea.  Her sodium was noted to be 114 at [facility] on 08/19/2019.  When she arrived at [facility] her labs were retested and her sodium was noted to be 112.  Her potassium was 2.9.  Her chloride was less than 65.  Her glucose was elevated at 134.  Her white blood cell count was elevated at 14.3.  Her platelet count was 317.  Her nausea improved after Zofran administration.  She is complaining of headache.  She was bolused 1L of NS IV fluids in the emergency department and given 40 mEq of oral potassium and admission was requested.  Review of Systems: All systems reviewed and apart from history of presenting illness, are negative.  Past Medical History:  Diagnosis Date  . COPD (chronic obstructive pulmonary disease) ([place])   . Depression    History reviewed. No pertinent surgical history. Social History:  reports that she has been smoking. She has never used smokeless tobacco. She reports that she does not drink alcohol or use drugs.  Allergies  Allergen Reactions  . Wellbutrin [Bupropion]     History reviewed. No pertinent family history.  Prior to Admission medications   Medication Sig Start Date End Date Taking? Authorizing  Provider  albuterol (VENTOLIN HFA) 108 (90 Base) MCG/ACT inhaler Inhale 2 puffs into the lungs every 4 (four) hours. 06/14/19  Yes [provider]  ALPRAZolam Duanne Moron) 0.5 MG tablet Take 1 tablet by mouth daily. 08/18/19  Yes [provider]  Fluticasone-Umeclidin-Vilant (TRELEGY ELLIPTA) 100-62.5-25 MCG/INH AEPB Take 1 puff by mouth daily. 07/13/19  Yes [provider]  hydrochlorothiazide (HYDRODIURIL) 25 MG tablet Take 1 tablet by mouth daily. 05/24/19 05/23/20 Yes [provider]  venlafaxine XR (EFFEXOR-XR) 75 MG 24 hr capsule Take 1 capsule by mouth daily. 06/13/19  Yes [provider]   Physical Exam: Vitals:   08/21/19 1130 08/21/19 1200 08/21/19 1247 08/21/19 1300  BP: 125/70 119/71 131/79 111/86  Pulse:   83 75  Resp:   14 17  Temp:      TempSrc:      SpO2:   95% 92%     General exam: Moderately built and nourished patient, lying comfortably supine on the gurney in no obvious distress.  Head, eyes and ENT: Nontraumatic and normocephalic. Pupils equally reacting to light and accommodation. Oral mucosa dry.  Neck: Supple. No JVD, carotid bruit or thyromegaly.  Lymphatics: No lymphadenopathy.  Respiratory system: Clear to auscultation. No increased work of breathing.  Cardiovascular system: S1 and S2 heard, RRR. No JVD, murmurs, gallops, clicks or pedal edema.  Gastrointestinal system: Abdomen is nondistended, soft and nontender. Normal bowel sounds heard. No organomegaly or masses appreciated.  Central  nervous system: Alert and oriented. No focal neurological deficits.  Extremities: Symmetric 5 x 5 power. Peripheral pulses symmetrically felt.   Skin: No rashes or acute findings.  Musculoskeletal system: Negative exam.  Psychiatry: Pleasant and cooperative.  Labs on Admission:  Basic Metabolic Panel: Recent Labs  Lab 08/19/19 2242 08/21/19 1058  NA 114* 112*  K 2.4* 2.6*  CL 68* <65*  CO2 26 34*  GLUCOSE 138* 134*  BUN 7  6  CREATININE 0.73 0.52  CALCIUM 9.4 9.6   Liver Function Tests: Recent Labs  Lab 08/19/19 2242 08/21/19 1058  AST 13* 14*  ALT 11 12  ALKPHOS 105 95  BILITOT 0.9 1.0  PROT 7.5 6.9  ALBUMIN 4.5 4.1   Recent Labs  Lab 08/19/19 2242 08/21/19 1058  LIPASE 26 30   No results for input(s): AMMONIA in the last 168 hours. CBC: Recent Labs  Lab 08/19/19 2242 08/21/19 1058  WBC 13.3* 14.3*  NEUTROABS  --  12.4*  HGB 15.9* 15.5*  HCT 42.5 RESULTS UNAVAILABLE DUE TO INTERFERING SUBSTANCE  MCV 87.6 RESULTS UNAVAILABLE DUE TO INTERFERING SUBSTANCE  PLT 418* 317   Cardiac Enzymes: No results for input(s): CKTOTAL, CKMB, CKMBINDEX, TROPONINI in the last 168 hours.  BNP (last 3 results) No results for input(s): PROBNP in the last 8760 hours. CBG: No results for input(s): GLUCAP in the last 168 hours.  Radiological Exams on Admission: No results found.  EKG: Personally reviewed. NSR.   Assessment/Plan Principal Problem:   Hyponatremia Active Problems:   High risk medication use   Hypokalemia   Leukocytosis   Nausea and vomiting   Essential hypertension   Depression   COPD (chronic obstructive pulmonary disease) (HCC)   Current smoker   GAD (generalized anxiety disorder)  1. Hyponatremia -it is unfortunate that patient left the ED on 08/19/19 to being seen and treated and now her sodium is even worse than it was on 08/19/19.  Plan is to discontinue hydrochlorothiazide and slowly improve sodium over the next several days.  She did receive 1 L bolus of normal saline in the ED but repeat BMP shows sodium now down to 111.  I called and discussed case with nephrologist on call Dr. Arlean Hopping who recommended starting concentrated saline infusion.  3% saline orderset initiated and frequent sodium testing.  Also recommended holding recently started Effexor as it can cause SIADH.  Urine osmolality urine electrolytes, serum osmolality tests have been ordered.  Check TSH.  Check vitamin  D.  Check CT brain. 2. Hypokalemia-likely secondary to diuretic and GI losses- she has received oral potassium 40 mEq x 1 dose, check magnesium levels and follow.  Replace as needed.  EKG no significant changes. 3. Leukocytosis-suspect this is a reactive leukocytosis given that she has had nausea and vomiting however I have ordered a CT brain ordered at chest x-ray and urine culture. 4. Essential hypertension-discontinue hydrochlorothiazide.  Follow blood pressures and treat as needed. 5. COPD-patient is a current smoker and has been counseled to discontinue.  Continue Trelegy and ordered supplemental oxygen as needed.  Bedside albuterol HFA ordered. 6. Nausea and vomiting- IV nausea medications ordered, refractory nausea medication ordered, hydrating as above.  Follow clinically. 7. GAD-alprazolam ordered as needed only. 8. Depression-resume home medication. 9. Headaches- CT brain ordered.  Ketorolac IV ordered as needed for severe pain symptoms.  Tylenol ordered as needed. 10. Chronic active nicotine dependence-patient strongly advised to discontinue tobacco use. 11. Hyperglycemia-likely stress-induced, check hemoglobin A1c.  DVT Prophylaxis: lovenox  Code Status: Full  Family Communication: Patient updated at bedside, verbalized understanding Disposition Plan: Inpatient for IV fluid hydration, close laboratory monitoring  Time spent: 63 minutes  Irmgard Rampersaud Laural BenesJohnson, MD [facility] How to contact the [facility] Attending or Consulting provider 7A - 7P or covering provider during after hours 7P -7A, for this patient?  1. Check the care team in [facility] and look for a) attending/consulting [facility] provider listed and b) the [facility] team listed 2. Log into [email or web address] and use [place] universal password to access. If you do not have the password, please contact the hospital operator. 3. Locate the [facility] provider you are looking for under [facility] and page to a number that you can be directly  reached. 4. If you still have difficulty reaching the provider, please page the [facility] (Director on Call) for the Hospitalists listed on amion for assistance.

## 2019-08-21 NOTE — Progress Notes (Signed)
CRITICAL VALUE ALERT  Critical Value:  Sodium 113  Date & Time Notied:  08/21/19 @ 2315  Provider Notified: Bodenheimer  Orders Received/Actions taken: Critical Sodium same as last time- no new orders

## 2019-08-21 NOTE — ED Notes (Signed)
Dr Wynetta Emery at bedside to assess.

## 2019-08-21 NOTE — Progress Notes (Signed)
CRITICAL VALUE ALERT  Critical Value:  Serum Osmolality 229  Date & Time Notied:  08/21/19 @ 2030  Provider Notified: Bodenheimer  Orders Received/Actions taken: Waiting for orders/call back

## 2019-08-21 NOTE — Progress Notes (Signed)
CRITICAL VALUE ALERT  Critical Value:  Potassium-2.2, Sodium-113  Date & Time Notied:  08/21/19 @ 1942  Provider Notified: Bodenheimer  Orders Received/Actions taken: Waiting for order/call back.

## 2019-08-21 NOTE — ED Notes (Signed)
ED TO INPATIENT HANDOFF REPORT  ED Nurse Name and Phone #: [phone]  S Name/Age/Gender Rebecca Liu 53 y.o. female Room/Bed: APA08/APA08  Code Status   Code Status: Not on file  Home/SNF/Other Home Patient oriented to: self, place, time and situation Is this baseline? Yes   Triage Complete: Triage complete  Chief Complaint Nausea  Triage Note [facility] and vomiting x 4 days. Pt received zofran 4 mg PTA. Pt denies abdominal pain.   Allergies Allergies  Allergen Reactions  . Wellbutrin [Bupropion]     Level of Care/Admitting Diagnosis ED Disposition    ED Disposition Condition Comment   Admit  [facility] [[id number]]  Level of Care: Telemetry [5]  Covid Evaluation: Asymptomatic Screening Protocol (No Symptoms)  Diagnosis: Hyponatremia [[id number]][[id number]]  Admitting Physician: Cleora FleetJOHNSON, CLANFORD L [[id number]]  Attending Physician: Cleora FleetJOHNSON, CLANFORD L [[id number]]  Estimated length of stay: past midnight tomorrow  Certification:: I certify this patient will need inpatient services for at least 2 midnights  PT Class (Do Not Modify): Inpatient [101]  PT Acc Code (Do Not Modify): Private [1]       B Medical/Surgery History Past Medical History:  Diagnosis Date  . COPD (chronic obstructive pulmonary disease) (HCC)   . Depression    History reviewed. No pertinent surgical history.   A IV Location/Drains/Wounds Patient Lines/Drains/Airways Status   Active Line/Drains/Airways    Name:   Placement date:   Placement time:   Site:   Days:   Peripheral IV 08/21/19 Left Antecubital   08/21/19    1049    Antecubital   less than 1          Intake/Output Last 24 hours No intake or output data in the 24 hours ending 08/21/19 1308  Labs/Imaging Results for orders placed or performed during the hospital encounter of 08/21/19 (from the past 48 hour(s))  Lipase, blood     Status: None   Collection Time: 08/21/19 10:58 AM   Result Value Ref Range   Lipase 30 11 - 51 U/L    Comment: Performed at [facility], [address] [place], [place] [address]  Comprehensive metabolic panel     Status: Abnormal   Collection Time: 08/21/19 10:58 AM  Result Value Ref Range   Sodium 112 (LL) 135 - 145 mmol/L    Comment: CRITICAL RESULT CALLED TO, READ BACK BY AND VERIFIED WITH: Shaiden Aldous,H@1200  BY MATTHEWS, B 10.4.2020    Potassium 2.6 (LL) 3.5 - 5.1 mmol/L    Comment: CRITICAL RESULT CALLED TO, READ BACK BY AND VERIFIED WITH: Gussie Murton,H@1200  BY MATTHEWS, B 10.4.2020    Chloride <65 (LL) 98 - 111 mmol/L    Comment: CRITICAL RESULT CALLED TO, READ BACK BY AND VERIFIED WITH: Vicente Weidler,H@1200  BY MATTHEWS, B 10.4.2020    CO2 34 (H) 22 - 32 mmol/L   Glucose, Bld 134 (H) 70 - 99 mg/dL   BUN 6 6 - 20 mg/dL   Creatinine, Ser 8.650.52 0.44 - 1.00 mg/dL   Calcium 9.6 8.9 - 78.410.3 mg/dL   Total Protein 6.9 6.5 - 8.1 g/dL   Albumin 4.1 3.5 - 5.0 g/dL   AST 14 (L) 15 - 41 U/L   ALT 12 0 - 44 U/L   Alkaline Phosphatase 95 38 - 126 U/L   Total Bilirubin 1.0 0.3 - 1.2 mg/dL   GFR calc non Af Amer >60 >60 mL/min   GFR calc Af Amer >60 >60 mL/min  Comment: Performed at [facility], [address] [place], [place] [address]  CBC     Status: Abnormal   Collection Time: 08/21/19 10:58 AM  Result Value Ref Range   WBC 14.3 (H) 4.0 - 10.5 K/uL   RBC RESULTS UNAVAILABLE DUE TO INTERFERING SUBSTANCE 3.87 - 5.11 MIL/uL   Hemoglobin 15.5 (H) 12.0 - 15.0 g/dL   HCT RESULTS UNAVAILABLE DUE TO INTERFERING SUBSTANCE 36.0 - 46.0 %   MCV RESULTS UNAVAILABLE DUE TO INTERFERING SUBSTANCE 80.0 - 100.0 fL   MCH RESULTS UNAVAILABLE DUE TO INTERFERING SUBSTANCE 26.0 - 34.0 pg   MCHC RESULTS UNAVAILABLE DUE TO INTERFERING SUBSTANCE 30.0 - 36.0 g/dL   RDW RESULTS UNAVAILABLE DUE TO INTERFERING SUBSTANCE 11.5 - 15.5 %   Platelets 317 150 - 400 K/uL   nRBC RESULTS UNAVAILABLE DUE TO INTERFERING SUBSTANCE 0.0 - 0.2 %    Comment: 0.0 Performed at  [facility], [address], [place], [place] [address]   Differential     Status: Abnormal   Collection Time: 08/21/19 10:58 AM  Result Value Ref Range   Neutrophils Relative % 87 %   Neutro Abs 12.4 (H) 1.7 - 7.7 K/uL   Lymphocytes Relative 6 %   Lymphs Abs 0.9 0.7 - 4.0 K/uL   Monocytes Relative 6 %   Monocytes Absolute 0.9 0.1 - 1.0 K/uL   Eosinophils Relative 0 %   Eosinophils Absolute 0.0 0.0 - 0.5 K/uL   Basophils Relative 0 %   Basophils Absolute 0.1 0.0 - 0.1 K/uL   Immature Granulocytes 1 %   Abs Immature Granulocytes 0.09 (H) 0.00 - 0.07 K/uL    Comment: Performed at [facility], [address], [place], [place] [address]   No results found.  Pending Labs Unresulted Labs (From admission, onward)    Start     Ordered   08/21/19 1250  Na and K (sodium & potassium), rand urine  Add-on,   AD     08/21/19 1249   08/21/19 1249  Osmolality  Add-on,   AD    Comments: With Next Blood Draw    08/21/19 1248   08/21/19 1249  Osmolality, urine  Add-on,   AD     08/21/19 1248   08/21/19 1248  Magnesium  Add-on,   AD     08/21/19 1247   08/21/19 1247  Culture, Urine  Add-on,   AD     08/21/19 1246   08/21/19 1247  TSH  Add-on,   AD     08/21/19 1246   08/21/19 1247  VITAMIN D 25 Hydroxy (Vit-D Deficiency, Fractures)  Add-on,   AD     08/21/19 1246   08/21/19 1229  SARS CORONAVIRUS 2 (TAT 6-24 HRS) Nasopharyngeal Nasopharyngeal Swab  (Asymptomatic/Tier 2 Patients Labs)  Once,   STAT    Question Answer Comment  Is this test for diagnosis or screening Screening   Symptomatic for COVID-19 as defined by CDC No   Hospitalized for COVID-19 No   Admitted to ICU for COVID-19 No   Previously tested for COVID-19 No   Resident in a congregate (group) care setting No   Employed in healthcare setting No   Pregnant No      08/21/19 1228   08/21/19 1049  Urinalysis, Routine w reflex microscopic  ONCE - STAT,   STAT     08/21/19 1048          Vitals/Pain Today's Vitals    08/21/19 1100 08/21/19 1130 08/21/19  1200 08/21/19 1247  BP: (!) 113/101 125/70 119/71 131/79  Pulse: 79   83  Resp:    14  Temp:      TempSrc:      SpO2: 95%   95%  PainSc:        Isolation Precautions No active isolations  Medications Medications  sodium chloride flush (NS) 0.9 % injection 3 mL (3 mLs Intravenous Not Given 08/21/19 1049)  potassium chloride 10 mEq in 100 mL IVPB (10 mEq Intravenous New Bag/Given 08/21/19 1247)  potassium chloride 10 mEq in 100 mL IVPB (has no administration in time range)  sodium chloride 0.9 % bolus 1,000 mL (1,000 mLs Intravenous New Bag/Given 08/21/19 1112)  ondansetron (ZOFRAN) injection 4 mg (4 mg Intravenous Given 08/21/19 1113)  ketorolac (TORADOL) 30 MG/ML injection 30 mg (30 mg Intravenous Given 08/21/19 1113)  potassium chloride SA (KLOR-CON) CR tablet 40 mEq (40 mEq Oral Given 08/21/19 1245)    Mobility walks Low fall risk   Focused Assessments    R Recommendations: See Admitting Provider Note  Report given to:   Additional Notes:

## 2019-08-21 NOTE — ED Notes (Signed)
Date and time results received: 08/21/19 1431 (use smartphrase ".now" to insert current time)  Test: Sodium, Potassium Critical Value: Sodium 111, Potassium 2.3 Name of Provider Notified: Dr Wynetta Emery Orders Received? Or Actions Taken?: NA

## 2019-08-22 DIAGNOSIS — G8929 Other chronic pain: Secondary | ICD-10-CM

## 2019-08-22 DIAGNOSIS — R519 Headache, unspecified: Secondary | ICD-10-CM

## 2019-08-22 LAB — SARS CORONAVIRUS 2 (TAT 6-24 HRS): SARS Coronavirus 2: NEGATIVE

## 2019-08-22 LAB — CBC WITH DIFFERENTIAL/PLATELET
Abs Immature Granulocytes: 0.02 10*3/uL (ref 0.00–0.07)
Basophils Absolute: 0 10*3/uL (ref 0.0–0.1)
Basophils Relative: 0 %
Eosinophils Absolute: 0.1 10*3/uL (ref 0.0–0.5)
Eosinophils Relative: 1 %
HCT: 34.3 % — ABNORMAL LOW (ref 36.0–46.0)
Hemoglobin: 12.8 g/dL (ref 12.0–15.0)
Immature Granulocytes: 0 %
Lymphocytes Relative: 16 %
Lymphs Abs: 1.3 10*3/uL (ref 0.7–4.0)
MCH: 32.7 pg (ref 26.0–34.0)
MCHC: 37.3 g/dL — ABNORMAL HIGH (ref 30.0–36.0)
MCV: 87.5 fL (ref 80.0–100.0)
Monocytes Absolute: 0.7 10*3/uL (ref 0.1–1.0)
Monocytes Relative: 8 %
Neutro Abs: 5.9 10*3/uL (ref 1.7–7.7)
Neutrophils Relative %: 75 %
Platelets: 251 10*3/uL (ref 150–400)
RBC: 3.92 MIL/uL (ref 3.87–5.11)
RDW: 12.7 % (ref 11.5–15.5)
WBC: 8 10*3/uL (ref 4.0–10.5)
nRBC: 0 % (ref 0.0–0.2)

## 2019-08-22 LAB — URINALYSIS, ROUTINE W REFLEX MICROSCOPIC
Bilirubin Urine: NEGATIVE
Glucose, UA: NEGATIVE mg/dL
Hgb urine dipstick: NEGATIVE
Ketones, ur: 5 mg/dL — AB
Leukocytes,Ua: NEGATIVE
Nitrite: NEGATIVE
Protein, ur: 30 mg/dL — AB
Specific Gravity, Urine: 1.017 (ref 1.005–1.030)
pH: 5 (ref 5.0–8.0)

## 2019-08-22 LAB — COMPREHENSIVE METABOLIC PANEL
ALT: 15 U/L (ref 0–44)
AST: 16 U/L (ref 15–41)
Albumin: 3.3 g/dL — ABNORMAL LOW (ref 3.5–5.0)
Alkaline Phosphatase: 77 U/L (ref 38–126)
Anion gap: 11 (ref 5–15)
BUN: 8 mg/dL (ref 6–20)
CO2: 27 mmol/L (ref 22–32)
Calcium: 7.6 mg/dL — ABNORMAL LOW (ref 8.9–10.3)
Chloride: 76 mmol/L — ABNORMAL LOW (ref 98–111)
Creatinine, Ser: 0.45 mg/dL (ref 0.44–1.00)
GFR calc Af Amer: >60 mL/min (ref >60)
GFR calc non Af Amer: >60 mL/min (ref >60)
Glucose, Bld: 92 mg/dL (ref 70–99)
Potassium: 2.9 mmol/L — ABNORMAL LOW (ref 3.5–5.1)
Sodium: 114 mmol/L — CL (ref 135–145)
Total Bilirubin: 1 mg/dL (ref 0.3–1.2)
Total Protein: 5.6 g/dL — ABNORMAL LOW (ref 6.5–8.1)

## 2019-08-22 LAB — SODIUM
Sodium: 115 mmol/L — CL (ref 135–145)
Sodium: 115 mmol/L — CL (ref 135–145)
Sodium: 115 mmol/L — CL (ref 135–145)

## 2019-08-22 LAB — NA AND K (SODIUM & POTASSIUM), RAND UR
Potassium Urine: 42 mmol/L
Sodium, Ur: 13 mmol/L

## 2019-08-22 LAB — CORTISOL: Cortisol, Plasma: 9.6 ug/dL

## 2019-08-22 LAB — BASIC METABOLIC PANEL
Anion gap: 9 (ref 5–15)
BUN: 9 mg/dL (ref 6–20)
CO2: 25 mmol/L (ref 22–32)
Calcium: 7.4 mg/dL — ABNORMAL LOW (ref 8.9–10.3)
Chloride: 79 mmol/L — ABNORMAL LOW (ref 98–111)
Creatinine, Ser: 0.44 mg/dL (ref 0.44–1.00)
GFR calc Af Amer: >60 mL/min (ref >60)
GFR calc non Af Amer: >60 mL/min (ref >60)
Glucose, Bld: 101 mg/dL — ABNORMAL HIGH (ref 70–99)
Potassium: 3.1 mmol/L — ABNORMAL LOW (ref 3.5–5.1)
Sodium: 113 mmol/L — CL (ref 135–145)

## 2019-08-22 LAB — POTASSIUM
Potassium: 3.1 mmol/L — ABNORMAL LOW (ref 3.5–5.1)
Potassium: 3.4 mmol/L — ABNORMAL LOW (ref 3.5–5.1)

## 2019-08-22 LAB — OSMOLALITY, URINE: Osmolality, Ur: 435 mOsm/kg (ref 300–900)

## 2019-08-22 LAB — MAGNESIUM: Magnesium: 1.6 mg/dL — ABNORMAL LOW (ref 1.7–2.4)

## 2019-08-22 MED ORDER — POTASSIUM CHLORIDE CRYS ER 20 MEQ PO TBCR
40.0000 meq | EXTENDED_RELEASE_TABLET | Freq: Once | ORAL | Status: AC
  Administered 2019-08-22: 10:00:00 40 meq via ORAL
  Filled 2019-08-22: qty 2

## 2019-08-22 MED ORDER — POTASSIUM CHLORIDE CRYS ER 20 MEQ PO TBCR
60.0000 meq | EXTENDED_RELEASE_TABLET | Freq: Once | ORAL | Status: AC
  Administered 2019-08-22: 12:00:00 60 meq via ORAL
  Filled 2019-08-22: qty 3

## 2019-08-22 MED ORDER — POTASSIUM CHLORIDE 10 MEQ/100ML IV SOLN
10.0000 meq | INTRAVENOUS | Status: AC
  Administered 2019-08-22 (×4): 10 meq via INTRAVENOUS
  Filled 2019-08-22 (×4): qty 100

## 2019-08-22 MED ORDER — POTASSIUM CHLORIDE CRYS ER 20 MEQ PO TBCR: 40.0000 meq | EXTENDED_RELEASE_TABLET | Freq: Once | ORAL | Status: DC

## 2019-08-22 MED ORDER — ALPRAZOLAM 0.25 MG PO TABS
0.2500 mg | ORAL_TABLET | Freq: Three times a day (TID) | ORAL | Status: DC | PRN
  Administered 2019-08-22: 12:00:00 0.25 mg via ORAL
  Administered 2019-08-22 – 2019-08-23 (×2): 0.5 mg via ORAL
  Filled 2019-08-22: qty 2
  Filled 2019-08-22: qty 1
  Filled 2019-08-22: qty 2

## 2019-08-22 MED ORDER — POTASSIUM CHLORIDE CRYS ER 20 MEQ PO TBCR
40.0000 meq | EXTENDED_RELEASE_TABLET | Freq: Once | ORAL | Status: AC
  Administered 2019-08-22: 21:00:00 40 meq via ORAL
  Filled 2019-08-22: qty 2

## 2019-08-22 MED ORDER — SODIUM CHLORIDE 3 % IV SOLN: INTRAVENOUS | Status: DC

## 2019-08-22 MED ORDER — ENOXAPARIN SODIUM 40 MG/0.4ML ~~LOC~~ SOLN
40.0000 mg | SUBCUTANEOUS | Status: DC
  Administered 2019-08-22 – 2019-08-24 (×3): 40 mg via SUBCUTANEOUS
  Filled 2019-08-22 (×3): qty 0.4

## 2019-08-22 MED ORDER — SODIUM CHLORIDE 3 % IV SOLN
INTRAVENOUS | Status: DC
  Administered 2019-08-22 – 2019-08-23 (×2): 40 mL/h via INTRAVENOUS
  Filled 2019-08-22: qty 500

## 2019-08-22 MED ORDER — ALBUTEROL SULFATE (2.5 MG/3ML) 0.083% IN NEBU
2.5000 mg | INHALATION_SOLUTION | RESPIRATORY_TRACT | Status: DC
  Administered 2019-08-22 – 2019-08-23 (×4): 2.5 mg via RESPIRATORY_TRACT
  Filled 2019-08-22 (×5): qty 3

## 2019-08-22 MED ORDER — ALBUTEROL SULFATE HFA 108 (90 BASE) MCG/ACT IN AERS
2.0000 | INHALATION_SPRAY | RESPIRATORY_TRACT | Status: DC
  Administered 2019-08-22 (×2): 2 via RESPIRATORY_TRACT
  Filled 2019-08-22: qty 6.7

## 2019-08-22 MED ORDER — MAGNESIUM SULFATE 4 GM/100ML IV SOLN
4.0000 g | Freq: Once | INTRAVENOUS | Status: AC
  Administered 2019-08-22: 10:00:00 4 g via INTRAVENOUS
  Filled 2019-08-22: qty 100

## 2019-08-22 NOTE — Progress Notes (Signed)
CRITICAL VALUE ALERT  Critical Value:  Sodium 113  Date & Time Notied:  08/22/19 @1110   Provider Notified: Dr. Wynetta Emery  Orders Received/Actions taken: No new orders currently

## 2019-08-22 NOTE — Progress Notes (Signed)
No new orders for critical sodium level at this time. Dr. Dwyane Dee made aware of pts potassium level of 2.9 after 6 bags of K given.

## 2019-08-22 NOTE — Progress Notes (Signed)
CRITICAL VALUE ALERT  Critical Value:  Sodium 115  Date & Time Notied:  08/22/2019 2040  Provider Notified: Baltazar Najjar  Orders Received/Actions taken: Awaiting orders

## 2019-08-22 NOTE — Progress Notes (Signed)
Anoro and Incruse  DPI's unavailable from pharmacy at 0900.

## 2019-08-22 NOTE — Progress Notes (Signed)
Pt trying to get OOB. Pt confused and constantly pulling off leads and trying to pull out IV. Pt redirected. Ativan has already been given and not due at this time. Pt very unsteady on feet and has almost fell twice (once getting to [facility] with 2 assist and this time the patients bed alarm was going off and pt almost fell trying to get her back in bed). Will continue to monitor pt.

## 2019-08-22 NOTE — Progress Notes (Addendum)
PROGRESS NOTE    Rebecca Liu  DQQ:[id number]  DOB: [birth year]  DOA: 08/21/2019 PCP: Alfonso Ellis, NP   Brief Admission Hx: 53 y.o. female smoker with COPD, hypertension taking hydrochlorothiazide 25 mg daily, depression and anxiety has been having symptoms of nausea and vomiting for the past several days.  She presented to [facility] emergency department on 08/19/2019 because she was sent due to having abnormal labs.  She ended up leaving without being seen because of the long wait time.  She presented to [facility] ED today complaining of 4 to 5 days of nausea vomiting and malaise.  EMS gave her 4 mg of Zofran prior to arrival.  She denies having abdominal pain.  She denies diarrhea.  Her sodium was noted to be 114 at [facility] on 08/19/2019.  When she arrived at [facility] her labs were retested and her sodium was noted to be 112.  Her potassium was 2.9.  Her chloride was less than 65.  Her glucose was elevated at 134.  Her white blood cell count was elevated at 14.3.  Her platelet count was 317.   MDM/Assessment & Plan:   1. Severe hyponatremia- patient was initially hypovolemic likely from the diuretics and now started on hypertonic saline solution.  Continue frequent sodium testing.  Urine lites and osmolality testing is still pending.  Appreciate the assistance of the nephrology service.  Continue to monitor closely. 2. Hypokalemia-likely secondary to GI losses and diuretics, replacing magnesium aggressively and oral and IV potassium ordered as well.  Continue to follow closely. 3. Leukocytosis-this was likely reactive as her white blood cell count has normalized today. 4. Essential hypertension-discontinued hydrochlorothiazide.  Follow-up blood pressures and treat as needed. 5. COPD-patient is a current smoker and has been counseled to stop smoking.  Continue home bronchodilators and supplemental oxygen if needed. 6. Nausea vomiting- this seems to be her improving, IV nausea medications  ordered as needed. 7. 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  Monitor for side effects of Effexor withdrawal.  Alprazolam ordered as needed for severe anxiety symptoms. 8. Chronic active nicotine dependence- will offer nicotine patch while in the hospital. 9. Hyperglycemia-likely stress induced A1c pending.  DVT prophylaxis: Lovenox Code Status: Full Family Communication: Spoke with husband by telephone Disposition Plan: Continue stepdown ICU monitoring for hypertonic saline, close monitoring subspecialty consultation   Consultants:  Nephrology  Procedures:    Antimicrobials:    Subjective: Pt complains of her "nerves" requesting xanax  Objective: Vitals:   08/22/19 1000 08/22/19 1100 08/22/19 1118 08/22/19 1200  BP: 114/78 122/65  110/61  Pulse: 89 90 95 91  Resp: 15 20 18 12   Temp:   98.4 F (36.9 C)   TempSrc:   Oral   SpO2: 91% 93% 95% 97%  Weight:      Height:        Intake/Output Summary (Last 24 hours) at 08/22/2019 1237 Last data filed at 08/22/2019 0736 Gross per 24 hour  Intake 2994.33 ml  Output 600 ml  Net 2394.33 ml   Filed Weights   08/21/19 1526 08/22/19 0500  Weight: 76.4 kg 79.3 kg    REVIEW OF SYSTEMS  As per history otherwise all reviewed and reported negative  Exam:  General exam: awake, alert, tolerating diet, NAD.  Respiratory system: Clear. No increased work of breathing. Cardiovascular system: S1 & S2 heard. No JVD, murmurs, gallops, clicks or pedal edema. Gastrointestinal system:  Abdomen is nondistended, soft and nontender. Normal bowel sounds heard. Central nervous system: Alert and oriented. No focal neurological deficits. Extremities: trace pretibial edema BLEs.  Data Reviewed: Basic Metabolic Panel: Recent Labs  Lab 08/19/19 2242 08/21/19 1058 08/21/19 1102 08/21/19 1327 08/21/19 1840 08/21/19 1841 08/21/19 2154 08/22/19 0229 08/22/19 0651  08/22/19 1024 08/22/19 1049  NA 114* 112*  --  111*  --  113* 113* 114* 115*  --  113*  K 2.4* 2.6*  --  2.3* 2.2*  --   --  2.9*  --  3.1* 3.1*  CL 68* <65*  --  67*  --   --   --  76*  --   --  79*  CO2 26 34*  --  30  --   --   --  27  --   --  25  GLUCOSE 138* 134*  --  145*  --   --   --  92  --   --  101*  BUN 7 6  --  7  --   --   --  8  --   --  9  CREATININE 0.73 0.52  --  0.48  --   --   --  0.45  --   --  0.44  CALCIUM 9.4 9.6  --  8.8*  --   --   --  7.6*  --   --  7.4*  MG  --   --  1.3*  --   --   --   --  1.6*  --   --   --    Liver Function Tests: Recent Labs  Lab 08/19/19 2242 08/21/19 1058 08/22/19 0229  AST 13* 14* 16  ALT 11 12 15   ALKPHOS 105 95 77  BILITOT 0.9 1.0 1.0  PROT 7.5 6.9 5.6*  ALBUMIN 4.5 4.1 3.3*   Recent Labs  Lab 08/19/19 2242 08/21/19 1058  LIPASE 26 30   No results for input(s): AMMONIA in the last 168 hours. CBC: Recent Labs  Lab 08/19/19 2242 08/21/19 1058 08/22/19 0229  WBC 13.3* 14.3* 8.0  NEUTROABS  --  12.4* 5.9  HGB 15.9* 15.5* 12.8  HCT 42.5 RESULTS UNAVAILABLE DUE TO INTERFERING SUBSTANCE 34.3*  MCV 87.6 RESULTS UNAVAILABLE DUE TO INTERFERING SUBSTANCE 87.5  PLT 418* 317 251   Cardiac Enzymes: No results for input(s): CKTOTAL, CKMB, CKMBINDEX, TROPONINI in the last 168 hours. CBG (last 3)  No results for input(s): GLUCAP in the last 72 hours. Recent Results (from the past 240 hour(s))  SARS CORONAVIRUS 2 (TAT 6-24 HRS) Nasopharyngeal Nasopharyngeal Swab     Status: None   Collection Time: 08/21/19  1:20 PM   Specimen: Nasopharyngeal Swab  Result Value Ref Range Status   SARS Coronavirus 2 NEGATIVE NEGATIVE Final    Comment: (NOTE) SARS-CoV-2 target nucleic acids are NOT DETECTED. The SARS-CoV-2 RNA is generally detectable in upper and lower respiratory specimens during the acute phase of infection. Negative results do not preclude SARS-CoV-2 infection, do not rule out co-infections with other pathogens, and  should not be used as the sole basis for treatment or other patient management decisions. Negative results must be combined with clinical observations, patient history, and epidemiological information. The expected result is Negative. Fact Sheet for Patients: HairSlick.nohttps://www.fda.gov/media/138098/download Fact Sheet for Healthcare Providers: quierodirigir.comhttps://www.fda.gov/media/138095/download This test is not yet approved or cleared by the [place] FDA and  has been authorized for detection and/or diagnosis of SARS-CoV-2 by  FDA under an Emergency Use Authorization (EUA). This EUA will remain  in effect (meaning this test can be used) for the duration of the COVID-19 declaration under Section 56 4(b)(1) of the Act, 21 U.S.C. section 360bbb-3(b)(1), unless the authorization is terminated or revoked sooner. Performed at [facility], [address] [place], [place] [address]   MRSA PCR Screening     Status: None   Collection Time: 08/21/19  3:21 PM   Specimen: Nasopharyngeal  Result Value Ref Range Status   MRSA by PCR NEGATIVE NEGATIVE Final    Comment:        The GeneXpert MRSA Assay (FDA approved for NASAL specimens only), is one component of a comprehensive MRSA colonization surveillance program. It is not intended to diagnose MRSA infection nor to guide or monitor treatment for MRSA infections. Performed at [facility], [address], [place], [place] [address]      Studies: Ct Head Wo Contrast  Result Date: 08/21/2019 CLINICAL DATA:  Headache, acute, normal neuro exam. Additional history provided: Nausea and vomiting for past several days. EXAM: CT HEAD WITHOUT CONTRAST TECHNIQUE: Contiguous axial images were obtained from the base of the skull through the vertex without intravenous contrast. COMPARISON:  No pertinent prior studies available for comparison. FINDINGS: Brain: No evidence of acute intracranial hemorrhage. No demarcated cortical infarction. No evidence of  intracranial mass. No midline shift or extra-axial fluid collection. Cerebral volume is normal for age. Vascular: No hyperdense vessel. Skull: Normal. Negative for fracture or focal lesion. Sinuses/Orbits: Visualized orbits demonstrate no acute abnormality. Visualized paranasal sinuses are clear. Visualized mastoid air cells are clear. IMPRESSION: No evidence of acute intracranial abnormality. Electronically Signed   By: Jackey Loge   On: 08/21/2019 15:05   Dg Chest Port 1 View  Result Date: 08/21/2019 CLINICAL DATA:  Nausea and vomiting, COPD EXAM: PORTABLE CHEST 1 VIEW COMPARISON:  02/23/2015 FINDINGS: Cardiomegaly. Both lungs are clear. The visualized skeletal structures are unremarkable. IMPRESSION: Cardiomegaly without acute abnormality of the lungs on rotated AP portable examination. Electronically Signed   By: Lauralyn Primes M.D.   On: 08/21/2019 13:34   Scheduled Meds: . albuterol  2 puff Inhalation Q4H  . Chlorhexidine Gluconate Cloth  6 each Topical Daily  . umeclidinium bromide  1 puff Inhalation Daily   And  . fluticasone furoate-vilanterol  1 puff Inhalation Daily  . nicotine  21 mg Transdermal Daily  . pantoprazole  40 mg Oral Daily   Continuous Infusions: . sodium chloride (hypertonic) 40 mL/hr (08/22/19 1218)   Principal Problem:   Hyponatremia Active Problems:   High risk medication use   Hypokalemia   Leukocytosis   Nausea and vomiting   Essential hypertension   Depression   COPD (chronic obstructive pulmonary disease) (HCC)   Current smoker   GAD (generalized anxiety disorder)   Chronic headache disorder   Hypomagnesemia  Critical care time spent: 33 minutes  Standley Dakins, MD [facility] 08/22/2019, 12:37 PM    LOS: 1 day  How to contact the [facility] Attending or Consulting provider 7A - 7P or covering provider during after hours 7P -7A, for this patient?  1. Check the care team in [facility] and look for a) attending/consulting [facility] provider listed and b) the  [facility] team listed 2. Log into [email or web address] and use [facility]'s universal password to access. If you do not have the password, please contact the hospital operator. 3. Locate the [facility] provider you are looking for under [facility] and  page to a number that you can be directly reached. 4. If you still have difficulty reaching the provider, please page the [facility] (Director on Call) for the Hospitalists listed on amion for assistance.

## 2019-08-22 NOTE — Progress Notes (Signed)
Pt constantly trying to get OOB, pulled out IV with blood everywhere. Pt has pulled off oxygen probe and leads. Pt can not answer where she is, but just answers "right here." RN sitting in room with patient so she will receive her IVF and potassium runs.

## 2019-08-22 NOTE — Progress Notes (Signed)
CRITICAL VALUE ALERT  Critical Value:  Sodium 113  Date & Time Notied: 08/22/19 @1525   Provider Notified: Dr. Wynetta Emery   Orders Received/Actions taken: No new orders currently

## 2019-08-22 NOTE — Progress Notes (Signed)
CRITICAL VALUE ALERT  Critical Value:  Sodium 114  Date & Time Notied:  08/22/19 @ 0330  Provider Notified: Wende Crease  Orders Received/Actions taken: Waiting for orders/call back.

## 2019-08-22 NOTE — Consult Note (Signed)
Reason for Consult: Hyponatremia Referring Physician: Brendalyn Vallely is an 53 y.o. female with COPD, cont smoking, anxiety /depresison on effexor as well as HTN on HCTZ.  Sodium in 2016 was within normal limits.  She was having N/V-  Sodium was noted to be 114 on 10/2.  When she eventually came to presentation on 10/4 sodium was 112, potassium 2.9, chloride 65.  She has been confused which has limited history taking.  Urine osm and sodium are pending.  She was started on 3% NACL overnight at 30 mls/hour after being around 2 liters positive-  Sodium is about the same 4 hours later - 3% only started at 7 AM.  Pt is still confused, appears euvolemic to slightly overloaded.  TSH within normal limits   Trend in Creatinine: Creatinine, Ser  Date/Time Value Ref Range Status  08/22/2019 02:29 AM 0.45 0.44 - 1.00 mg/dL Final  08/21/2019 01:27 PM 0.48 0.44 - 1.00 mg/dL Final  08/21/2019 10:58 AM 0.52 0.44 - 1.00 mg/dL Final  08/19/2019 10:42 PM 0.73 0.44 - 1.00 mg/dL Final  02/23/2015 02:28 PM 0.80 0.50 - 1.10 mg/dL Final  11/23/2011 05:30 PM 0.90 0.50 - 1.10 mg/dL Final  07/01/2011 06:06 AM 0.55 0.50 - 1.10 mg/dL Final  07/01/2011 01:18 AM 0.70 0.50 - 1.10 mg/dL Final   Sodium  Date/Time Value Ref Range Status  08/22/2019 02:29 AM 114 (LL) 135 - 145 mmol/L Final    Comment:    CRITICAL RESULT CALLED TO, READ BACK BY AND VERIFIED WITH: AMBURN,A @ 0330 ON 08/22/19 BY JUW   08/21/2019 09:54 PM 113 (LL) 135 - 145 mmol/L Final    Comment:    CRITICAL RESULT CALLED TO, READ BACK BY AND VERIFIED WITH: HARRIS,B. AT 2324 ON 08/21/2019 BY EVA Performed at [facility], [address], [place], [place] [address]   08/21/2019 06:41 PM 113 (LL) 135 - 145 mmol/L Final    Comment:    CRITICAL RESULT CALLED TO, READ BACK BY AND VERIFIED WITH: HARRIS,B. 1937 ON 08/21/2019 BY EVA Performed at [facility], [address] [place], [place] [address]   08/21/2019 01:27 PM 111 (LL) 135 - 145 mmol/L  Final    Comment:    CRITICAL RESULT CALLED TO, READ BACK BY AND VERIFIED WITH: CRAWFORD,H. AT 1422 ON 08/21/2019 BY EVA   08/21/2019 10:58 AM 112 (LL) 135 - 145 mmol/L Final    Comment:    CRITICAL RESULT CALLED TO, READ BACK BY AND VERIFIED WITH: CRAWFORD,H@1200  BY MATTHEWS, B 10.4.2020   08/19/2019 10:42 PM 114 (LL) 135 - 145 mmol/L Final    Comment:    CRITICAL RESULT CALLED TO, READ BACK BY AND VERIFIED WITH: CHRISCOE,C RN 08/19/2019 2329 JORDANS   02/23/2015 02:28 PM 143 135 - 145 mmol/L Final  11/23/2011 05:30 PM 144 135 - 145 mEq/L Final  07/01/2011 06:06 AM 138 135 - 145 mEq/L Final  07/01/2011 01:18 AM 140 135 - 145 mEq/L Final   PMH:   Past Medical History:  Diagnosis Date  . COPD (chronic obstructive pulmonary disease) ([place])   . Depression     PSH:  History reviewed. No pertinent surgical history.  Allergies:  Allergies  Allergen Reactions  . Wellbutrin [Bupropion]     Medications:   Prior to Admission medications   Medication Sig Start Date End Date Taking? Authorizing Provider  albuterol (VENTOLIN HFA) 108 (90 Base) MCG/ACT inhaler Inhale 2 puffs into the lungs every 4 (four) hours. 06/14/19  Yes [provider]  ALPRAZolam (XANAX) 0.5 MG tablet Take 1 tablet by mouth daily. 08/18/19  Yes [provider]  Fluticasone-Umeclidin-Vilant (TRELEGY ELLIPTA) 100-62.5-25 MCG/INH AEPB Take 1 puff by mouth daily. 07/13/19  Yes [provider]  hydrochlorothiazide (HYDRODIURIL) 25 MG tablet Take 1 tablet by mouth daily. 05/24/19 05/23/20 Yes [provider]  venlafaxine XR (EFFEXOR-XR) 75 MG 24 hr capsule Take 1 capsule by mouth daily. 06/13/19  Yes [provider]    Discontinued Meds:   Medications Discontinued During This Encounter  Medication Reason  . meloxicam (MOBIC) 15 MG tablet Completed Course  . escitalopram (LEXAPRO) 10 MG tablet Completed Course  . predniSONE (DELTASONE) 10 MG tablet Completed Course  . predniSONE  (DELTASONE) 20 MG tablet Completed Course  . benzonatate (TESSALON) 100 MG capsule Completed Course  . guaiFENesin-codeine (ROBITUSSIN AC) 100-10 MG/5ML syrup Completed Course  . azithromycin (ZITHROMAX Z-PAK) 250 MG tablet Completed Course  . clarithromycin (BIAXIN) 500 MG tablet Completed Course  . ALPRAZolam (XANAX) tablet 0.25 mg   . 0.9 % NaCl with KCl 20 mEq/ L  infusion   . sodium chloride flush (NS) 0.9 % injection 3 mL   . Fluticasone-Umeclidin-Vilant 100-62.5-25 MCG/INH AEPB 1 puff P&T Policy: Therapeutic Substitute  . albuterol (VENTOLIN HFA) 108 (90 Base) MCG/ACT inhaler 2 puff   . albuterol (PROVENTIL) (2.5 MG/3ML) 0.083% nebulizer solution 2.5 mg   . albuterol (VENTOLIN HFA) 108 (90 Base) MCG/ACT inhaler 2 puff P&T Policy: Therapeutic Substitute  . venlafaxine XR (EFFEXOR-XR) 24 hr capsule 75 mg   . sodium chloride (hypertonic) 3 % solution   . albuterol (PROVENTIL) (2.5 MG/3ML) 0.083% nebulizer solution 2.5 mg     Social History:  reports that she has been smoking. She has never used smokeless tobacco. She reports that she does not drink alcohol or use drugs.  Family History:  History reviewed. No pertinent family history.  Review of systems not obtained due to patient factors.  Blood pressure 106/78, pulse 78, temperature 98.3 F (36.8 C), temperature source Axillary, resp. rate 12, height 5\' 4"  (1.626 m), weight 79.3 kg, SpO2 95 %. General appearance: appears older than stated age, fatigued and slowed mentation Resp: clear to auscultation bilaterally Cardio: regular rate and rhythm, S1, S2 normal, no murmur, click, rub or gallop GI: soft, non-tender; bowel sounds normal; no masses,  no organomegaly Extremities: edema trace  Labs: Basic Metabolic Panel: Recent Labs  Lab 08/19/19 2242 08/21/19 1058 08/21/19 1327 08/21/19 1840 08/21/19 1841 08/21/19 2154 08/22/19 0229  NA 114* 112* 111*  --  113* 113* 114*  K 2.4* 2.6* 2.3* 2.2*  --   --  2.9*  CL 68* <65*  67*  --   --   --  76*  CO2 26 34* 30  --   --   --  27  GLUCOSE 138* 134* 145*  --   --   --  92  BUN 7 6 7   --   --   --  8  CREATININE 0.73 0.52 0.48  --   --   --  0.45  ALBUMIN 4.5 4.1  --   --   --   --  3.3*  CALCIUM 9.4 9.6 8.8*  --   --   --  7.6*   Liver Function Tests: Recent Labs  Lab 08/19/19 2242 08/21/19 1058 08/22/19 0229  AST 13* 14* 16  ALT 11 12 15   ALKPHOS 105 95 77  BILITOT 0.9 1.0 1.0  PROT 7.5  6.9 5.6*  ALBUMIN 4.5 4.1 3.3*   Recent Labs  Lab 08/19/19 2242 08/21/19 1058  LIPASE 26 30   No results for input(s): AMMONIA in the last 168 hours. CBC: Recent Labs  Lab 08/19/19 2242 08/21/19 1058 08/22/19 0229  WBC 13.3* 14.3* 8.0  NEUTROABS  --  12.4* 5.9  HGB 15.9* 15.5* 12.8  HCT 42.5 RESULTS UNAVAILABLE DUE TO INTERFERING SUBSTANCE 34.3*  MCV 87.6 RESULTS UNAVAILABLE DUE TO INTERFERING SUBSTANCE 87.5  PLT 418* 317 251   PT/INR: @labrcntip (inr:5) Cardiac Enzymes: No results for input(s): CKTOTAL, CKMB, CKMBINDEX, TROPONINI in the last 168 hours. CBG: No results for input(s): GLUCAP in the last 168 hours.  Iron Studies: No results for input(s): IRON, TIBC, TRANSFERRIN, FERRITIN in the last 168 hours.  Xrays/Other Studies: Ct Head Wo Contrast  Result Date: 08/21/2019 CLINICAL DATA:  Headache, acute, normal neuro exam. Additional history provided: Nausea and vomiting for past several days. EXAM: CT HEAD WITHOUT CONTRAST TECHNIQUE: Contiguous axial images were obtained from the base of the skull through the vertex without intravenous contrast. COMPARISON:  No pertinent prior studies available for comparison. FINDINGS: Brain: No evidence of acute intracranial hemorrhage. No demarcated cortical infarction. No evidence of intracranial mass. No midline shift or extra-axial fluid collection. Cerebral volume is normal for age. Vascular: No hyperdense vessel. Skull: Normal. Negative for fracture or focal lesion. Sinuses/Orbits: Visualized orbits  demonstrate no acute abnormality. Visualized paranasal sinuses are clear. Visualized mastoid air cells are clear. IMPRESSION: No evidence of acute intracranial abnormality. Electronically Signed   By: 10/21/2019   On: 08/21/2019 15:05   Dg Chest Port 1 View  Result Date: 08/21/2019 CLINICAL DATA:  Nausea and vomiting, COPD EXAM: PORTABLE CHEST 1 VIEW COMPARISON:  02/23/2015 FINDINGS: Cardiomegaly. Both lungs are clear. The visualized skeletal structures are unremarkable. IMPRESSION: Cardiomegaly without acute abnormality of the lungs on rotated AP portable examination. Electronically Signed   By: 04/25/2015 M.D.   On: 08/21/2019 13:34     Assessment/Plan: 53 year old WF with COPD, HTN on HCTZ and anxiety on effexor presents with hyponatremia and MS change 1) Hyponatremia- appears to be euvolemic/previously slightly hypovolemic hyponatremia so c/w SIADH now that volume has been given.  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  HCTZ can also cause hyponatremia but more of a hypovolemic picture but also on hold.  I will order a random cortisol.  I agree with 3 % saline for her mental status changes acutely.  Checking sodium every 4 hours so will be following through the day and take action as needed.  Consider samsca if not much progress is made through the day and feel that volume status is replete  2) Hypokalemia-  Being repleted per IV and orally as able     08[birth year]1984 08/22/2019, 9:08 AM

## 2019-08-23 LAB — COMPREHENSIVE METABOLIC PANEL
ALT: 15 U/L (ref 0–44)
AST: 13 U/L — ABNORMAL LOW (ref 15–41)
Albumin: 3 g/dL — ABNORMAL LOW (ref 3.5–5.0)
Alkaline Phosphatase: 70 U/L (ref 38–126)
Anion gap: 7 (ref 5–15)
BUN: 8 mg/dL (ref 6–20)
CO2: 24 mmol/L (ref 22–32)
Calcium: 7.5 mg/dL — ABNORMAL LOW (ref 8.9–10.3)
Chloride: 90 mmol/L — ABNORMAL LOW (ref 98–111)
Creatinine, Ser: 0.4 mg/dL — ABNORMAL LOW (ref 0.44–1.00)
GFR calc Af Amer: >60 mL/min (ref >60)
GFR calc non Af Amer: >60 mL/min (ref >60)
Glucose, Bld: 94 mg/dL (ref 70–99)
Potassium: 3.9 mmol/L (ref 3.5–5.1)
Sodium: 121 mmol/L — ABNORMAL LOW (ref 135–145)
Total Bilirubin: 0.6 mg/dL (ref 0.3–1.2)
Total Protein: 5.2 g/dL — ABNORMAL LOW (ref 6.5–8.1)

## 2019-08-23 LAB — CBC WITH DIFFERENTIAL/PLATELET
Abs Immature Granulocytes: 0.02 10*3/uL (ref 0.00–0.07)
Basophils Absolute: 0 10*3/uL (ref 0.0–0.1)
Basophils Relative: 0 %
Eosinophils Absolute: 0.1 10*3/uL (ref 0.0–0.5)
Eosinophils Relative: 2 %
HCT: 33.7 % — ABNORMAL LOW (ref 36.0–46.0)
Hemoglobin: 12.1 g/dL (ref 12.0–15.0)
Immature Granulocytes: 0 %
Lymphocytes Relative: 18 %
Lymphs Abs: 1.3 10*3/uL (ref 0.7–4.0)
MCH: 32.5 pg (ref 26.0–34.0)
MCHC: 35.9 g/dL (ref 30.0–36.0)
MCV: 90.6 fL (ref 80.0–100.0)
Monocytes Absolute: 0.6 10*3/uL (ref 0.1–1.0)
Monocytes Relative: 8 %
Neutro Abs: 5.2 10*3/uL (ref 1.7–7.7)
Neutrophils Relative %: 72 %
Platelets: 251 10*3/uL (ref 150–400)
RBC: 3.72 MIL/uL — ABNORMAL LOW (ref 3.87–5.11)
RDW: 12.9 % (ref 11.5–15.5)
WBC: 7.2 10*3/uL (ref 4.0–10.5)
nRBC: 0 % (ref 0.0–0.2)

## 2019-08-23 LAB — URINE CULTURE: Culture: NO GROWTH

## 2019-08-23 LAB — SODIUM
Sodium: 118 mmol/L — CL (ref 135–145)
Sodium: 120 mmol/L — ABNORMAL LOW (ref 135–145)
Sodium: 123 mmol/L — ABNORMAL LOW (ref 135–145)
Sodium: 128 mmol/L — ABNORMAL LOW (ref 135–145)
Sodium: 131 mmol/L — ABNORMAL LOW (ref 135–145)

## 2019-08-23 LAB — MAGNESIUM: Magnesium: 1.9 mg/dL (ref 1.7–2.4)

## 2019-08-23 LAB — POTASSIUM: Potassium: 3.6 mmol/L (ref 3.5–5.1)

## 2019-08-23 MED ORDER — ALBUTEROL SULFATE (2.5 MG/3ML) 0.083% IN NEBU: 2.5000 mg | INHALATION_SOLUTION | RESPIRATORY_TRACT | Status: DC | PRN

## 2019-08-23 MED ORDER — SODIUM CHLORIDE 0.9 % IV SOLN
INTRAVENOUS | Status: AC
  Administered 2019-08-23: 11:00:00 via INTRAVENOUS

## 2019-08-23 MED ORDER — VENLAFAXINE HCL ER 75 MG PO CP24
75.0000 mg | ORAL_CAPSULE | Freq: Every day | ORAL | Status: DC
  Administered 2019-08-23 – 2019-08-24 (×2): 75 mg via ORAL
  Filled 2019-08-23 (×2): qty 1

## 2019-08-23 MED ORDER — ALPRAZOLAM 1 MG PO TABS
1.0000 mg | ORAL_TABLET | Freq: Three times a day (TID) | ORAL | Status: DC | PRN
  Administered 2019-08-23 – 2019-08-25 (×6): 1 mg via ORAL
  Filled 2019-08-23: qty 2
  Filled 2019-08-23: qty 1
  Filled 2019-08-23 (×3): qty 2
  Filled 2019-08-23: qty 1

## 2019-08-23 NOTE — Progress Notes (Addendum)
PROGRESS NOTE    Rebecca Liu  AJO:[id number]  DOB: [birth year]  DOA: 08/21/2019 PCP: Ardell Isaacs, NP   Brief Admission Hx: 53 y.o. female smoker with COPD, hypertension taking hydrochlorothiazide 25 mg daily, depression and anxiety has been having symptoms of nausea and vomiting for the past several days.  She presented to [facility] emergency department on 08/19/2019 because she was sent due to having abnormal labs.  She ended up leaving without being seen because of the long wait time.  She presented to [facility] ED today complaining of 4 to 5 days of nausea vomiting and malaise.  EMS gave her 4 mg of Zofran prior to arrival.  She denies having abdominal pain.  She denies diarrhea.  Her sodium was noted to be 114 at [facility] on 08/19/2019.  When she arrived at [facility] her labs were retested and her sodium was noted to be 112.  Her potassium was 2.9.  Her chloride was less than 65.  Her glucose was elevated at 134.  Her white blood cell count was elevated at 14.3.  Her platelet count was 317.   MDM/Assessment & Plan:   1. Severe hyponatremia- patient was initially hypovolemic likely from the diuretics and now started on hypertonic saline solution due to mental status changes.  Continue frequent sodium testing.  Sodium levels are improving.  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  Appreciate the assistance of the nephrology service.   2. Hypokalemia-replaced.  Continue to follow closely. 3. Hypomagnesemia - repleted.  4. Leukocytosis-this was likely reactive as her white blood cell count has normalized. 5. Encephalopathy - likely due to Effexor withdrawal which has been restarted 08/23/19. 6. Essential hypertension-discontinued hydrochlorothiazide.  Follow-up blood pressures and treat as needed. 7. COPD-patient is a current smoker and has been counseled to stop smoking.  Continue home bronchodilators and supplemental oxygen if needed. 8. Nausea vomiting- this seems to be improving, IV  nausea medications ordered as needed. 9. GAD- Effexor was initially on hold as this can have SIADH effects per nephrology but due to severe withdrawal this was restarted 10/6.   Alprazolam ordered as needed for severe anxiety symptoms. 10. Chronic active nicotine dependence- will offer nicotine patch while in the hospital. 11. Hyperglycemia-likely stress induced A1c 5.9%.  DVT prophylaxis: Lovenox Code Status: Full Family Communication: Spoke with husband by telephone Disposition Plan: Continue stepdown ICU monitoring for hypertonic saline, close monitoring subspecialty consultation   Consultants:  Nephrology  Procedures:    Antimicrobials:    Subjective: Pt with continued confusion and anxiety symptoms  Objective: Vitals:   08/23/19 0600 08/23/19 0700 08/23/19 0724 08/23/19 0759  BP: (!) 121/91 103/68    Pulse: (!) 39     Resp: 20 13 17    Temp:   98.3 F (36.8 C)   TempSrc:   Oral   SpO2: (!) 85%   100%  Weight:      Height:        Intake/Output Summary (Last 24 hours) at 08/23/2019 0845 Last data filed at 08/23/2019 10/23/2019 Gross per 24 hour  Intake 1049 ml  Output 500 ml  Net 549 ml   Filed Weights   08/21/19 1526 08/22/19 0500 08/23/19 0500  Weight: 76.4 kg 79.3 kg 81.9 kg   REVIEW OF SYSTEMS  As per history otherwise all reviewed and reported negative  Exam:  General exam: confused, tolerating diet, NAD.  Respiratory system: Clear. No increased work of breathing. Cardiovascular system: S1 & S2 heard. No JVD, murmurs,  gallops, clicks or pedal edema. Gastrointestinal system: Abdomen is nondistended, soft and nontender. Normal bowel sounds heard. Central nervous system: Alert and oriented. No focal neurological deficits. Extremities: trace pretibial edema BLEs.  Data Reviewed: Basic Metabolic Panel: Recent Labs  Lab 08/21/19 1058 08/21/19 1102 08/21/19 1327  08/22/19 0229  08/22/19 1024 08/22/19 1049 08/22/19 1453 08/22/19 1945 08/23/19 0025  08/23/19 0430  NA 112*  --  111*   < > 114*   < >  --  113* 115* 115* 118* 121*  120*  K 2.6*  --  2.3*   < > 2.9*  --  3.1* 3.1*  --  3.4* 3.6 3.9  CL <65*  --  67*  --  76*  --   --  79*  --   --   --  90*  CO2 34*  --  30  --  27  --   --  25  --   --   --  24  GLUCOSE 134*  --  145*  --  92  --   --  101*  --   --   --  94  BUN 6  --  7  --  8  --   --  9  --   --   --  8  CREATININE 0.52  --  0.48  --  0.45  --   --  0.44  --   --   --  0.40*  CALCIUM 9.6  --  8.8*  --  7.6*  --   --  7.4*  --   --   --  7.5*  MG  --  1.3*  --   --  1.6*  --   --   --   --   --   --  1.9   < > = values in this interval not displayed.   Liver Function Tests: Recent Labs  Lab 08/19/19 2242 08/21/19 1058 08/22/19 0229 08/23/19 0430  AST 13* 14* 16 13*  ALT 11 12 15 15   ALKPHOS 105 95 77 70  BILITOT 0.9 1.0 1.0 0.6  PROT 7.5 6.9 5.6* 5.2*  ALBUMIN 4.5 4.1 3.3* 3.0*   Recent Labs  Lab 08/19/19 2242 08/21/19 1058  LIPASE 26 30   No results for input(s): AMMONIA in the last 168 hours. CBC: Recent Labs  Lab 08/19/19 2242 08/21/19 1058 08/22/19 0229 08/23/19 0430  WBC 13.3* 14.3* 8.0 7.2  NEUTROABS  --  12.4* 5.9 5.2  HGB 15.9* 15.5* 12.8 12.1  HCT 42.5 RESULTS UNAVAILABLE DUE TO INTERFERING SUBSTANCE 34.3* 33.7*  MCV 87.6 RESULTS UNAVAILABLE DUE TO INTERFERING SUBSTANCE 87.5 90.6  PLT 418* 317 251 251   Cardiac Enzymes: No results for input(s): CKTOTAL, CKMB, CKMBINDEX, TROPONINI in the last 168 hours. CBG (last 3)  No results for input(s): GLUCAP in the last 72 hours. Recent Results (from the past 240 hour(s))  SARS CORONAVIRUS 2 (TAT 6-24 HRS) Nasopharyngeal Nasopharyngeal Swab     Status: None   Collection Time: 08/21/19  1:20 PM   Specimen: Nasopharyngeal Swab  Result Value Ref Range Status   SARS Coronavirus 2 NEGATIVE NEGATIVE Final    Comment: (NOTE) SARS-CoV-2 target nucleic acids are NOT DETECTED. The SARS-CoV-2 RNA is generally detectable in upper and lower  respiratory specimens during the acute phase of infection. Negative results do not preclude SARS-CoV-2 infection, do not rule out co-infections with other pathogens, and should not be used as the sole  basis for treatment or other patient management decisions. Negative results must be combined with clinical observations, patient history, and epidemiological information. The expected result is Negative. Fact Sheet for Patients: SugarRoll.be Fact Sheet for Healthcare Providers: https://www.woods-mathews.com/ This test is not yet approved or cleared by the [place] FDA and  has been authorized for detection and/or diagnosis of SARS-CoV-2 by FDA under an Emergency Use Authorization (EUA). This EUA will remain  in effect (meaning this test can be used) for the duration of the COVID-19 declaration under Section 56 4(b)(1) of the Act, 21 U.S.C. section 360bbb-3(b)(1), unless the authorization is terminated or revoked sooner. Performed at [facility], [place] [address], [place], [place] [address]   MRSA PCR Screening     Status: None   Collection Time: 08/21/19  3:21 PM   Specimen: Nasopharyngeal  Result Value Ref Range Status   MRSA by PCR NEGATIVE NEGATIVE Final    Comment:        The GeneXpert MRSA Assay (FDA approved for NASAL specimens only), is one component of a comprehensive MRSA colonization surveillance program. It is not intended to diagnose MRSA infection nor to guide or monitor treatment for MRSA infections. Performed at [facility], [address] [place], [place] [address]      Studies: Ct Head Wo Contrast  Result Date: 08/21/2019 CLINICAL DATA:  Headache, acute, normal neuro exam. Additional history provided: Nausea and vomiting for past several days. EXAM: CT HEAD WITHOUT CONTRAST TECHNIQUE: Contiguous axial images were obtained from the base of the skull through the vertex without intravenous contrast. COMPARISON:   No pertinent prior studies available for comparison. FINDINGS: Brain: No evidence of acute intracranial hemorrhage. No demarcated cortical infarction. No evidence of intracranial mass. No midline shift or extra-axial fluid collection. Cerebral volume is normal for age. Vascular: No hyperdense vessel. Skull: Normal. Negative for fracture or focal lesion. Sinuses/Orbits: Visualized orbits demonstrate no acute abnormality. Visualized paranasal sinuses are clear. Visualized mastoid air cells are clear. IMPRESSION: No evidence of acute intracranial abnormality. Electronically Signed   By: Kellie Simmering   On: 08/21/2019 15:05   Dg Chest Port 1 View  Result Date: 08/21/2019 CLINICAL DATA:  Nausea and vomiting, COPD EXAM: PORTABLE CHEST 1 VIEW COMPARISON:  02/23/2015 FINDINGS: Cardiomegaly. Both lungs are clear. The visualized skeletal structures are unremarkable. IMPRESSION: Cardiomegaly without acute abnormality of the lungs on rotated AP portable examination. Electronically Signed   By: Eddie Candle M.D.   On: 08/21/2019 13:34   Scheduled Meds: . albuterol  2.5 mg Nebulization Q4H  . Chlorhexidine Gluconate Cloth  6 each Topical Daily  . enoxaparin (LOVENOX) injection  40 mg Subcutaneous Q24H  . umeclidinium bromide  1 puff Inhalation Daily   And  . fluticasone furoate-vilanterol  1 puff Inhalation Daily  . nicotine  21 mg Transdermal Daily  . pantoprazole  40 mg Oral Daily   Continuous Infusions: . sodium chloride    . sodium chloride (hypertonic) 40 mL/hr (08/23/19 0757)   Principal Problem:   Hyponatremia Active Problems:   High risk medication use   Hypokalemia   Leukocytosis   Nausea and vomiting   Essential hypertension   Depression   COPD (chronic obstructive pulmonary disease) (HCC)   Current smoker   GAD (generalized anxiety disorder)   Chronic headache disorder   Hypomagnesemia  Critical care time spent: 31 minutes  Clanford Johnson, MD [facility] 08/23/2019, 8:45  AM    LOS: 2 days  How to contact the  [facility] Attending or Consulting provider 7A - 7P or covering provider during after hours 7P -7A, for this patient?  1. Check the care team in [facility] and look for a) attending/consulting [facility] provider listed and b) the [facility] team listed 2. Log into [email or web address] and use [place]'s universal password to access. If you do not have the password, please contact the hospital operator. 3. Locate the [facility] provider you are looking for under [facility] and page to a number that you can be directly reached. 4. If you still have difficulty reaching the provider, please page the [facility] (Director on Call) for the Hospitalists listed on amion for assistance.

## 2019-08-23 NOTE — Progress Notes (Signed)
Noted last sodium up to 128--- will stop the 3% saline but continue to follow sodium levels   Louis Meckel

## 2019-08-23 NOTE — Progress Notes (Signed)
Subjective:  Remains confused overnight but hemodynamically stable- sodium is correcting slowly-  Up from 112 to 121  over the last 30 hours or so on 3% saline- K is better as well.  Urine sodium is low, urine osm is inappropriately high  Objective Vital signs in last 24 hours: Vitals:   08/23/19 0500 08/23/19 0600 08/23/19 0700 08/23/19 0724  BP:  (!) 121/91 103/68   Pulse: 88 (!) 39    Resp: 15 20 13 17   Temp:    98.3 F (36.8 C)  TempSrc:    Oral  SpO2: 100% (!) 85%    Weight: 81.9 kg     Height:       Weight change: 5.5 kg  Intake/Output Summary (Last 24 hours) at 08/23/2019 10/23/2019 Last data filed at 08/23/2019 10/23/2019 Gross per 24 hour  Intake 1049 ml  Output 500 ml  Net 549 ml    Assessment/Plan: 53 year old WF with COPD, HTN on HCTZ and anxiety on effexor presents with hyponatremia and MS change 1) Hyponatremia- appears to be euvolemic/ slightly hypovolemic hyponatremia so c/w SIADH (high urine osm confirms as well)  now that volume has been given.  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  HCTZ can also cause hyponatremia but more of a hypovolemic picture but also on hold.   random cortisol and TSH are OK.  I will give one liter NS today to make sure volume is replete.    Will also continue 3 % saline - now at 40 ccs per hour for her mental status changes acutely until sodium is above 125.  Checking sodium every 6 hours so will be following through the day and take action as needed.   2) Hypokalemia-  Being repleted per IV and orally as able - is now within a normal range    08-07-1984    Labs: Basic Metabolic Panel: Recent Labs  Lab 08/22/19 0229  08/22/19 1049  08/22/19 1945 08/23/19 0025 08/23/19 0430  NA 114*   < > 113*   < > 115* 118* 121*  120*  K 2.9*   < > 3.1*  --  3.4* 3.6 3.9  CL 76*  --  79*  --   --   --  90*  CO2 27  --  25  --   --   --  24  GLUCOSE 92  --  101*  --   --   --  94  BUN 8  --  9  --   --   --  8  CREATININE  0.45  --  0.44  --   --   --  0.40*  CALCIUM 7.6*  --  7.4*  --   --   --  7.5*   < > = values in this interval not displayed.   Liver Function Tests: Recent Labs  Lab 08/21/19 1058 08/22/19 0229 08/23/19 0430  AST 14* 16 13*  ALT 12 15 15   ALKPHOS 95 77 70  BILITOT 1.0 1.0 0.6  PROT 6.9 5.6* 5.2*  ALBUMIN 4.1 3.3* 3.0*   Recent Labs  Lab 08/19/19 2242 08/21/19 1058  LIPASE 26 30   No results for input(s): AMMONIA in the last 168 hours. CBC: Recent Labs  Lab 08/19/19 2242 08/21/19 1058 08/22/19 0229 08/23/19 0430  WBC 13.3* 14.3* 8.0 7.2  NEUTROABS  --  12.4* 5.9 5.2  HGB 15.9* 15.5* 12.8 12.1  HCT 42.5  RESULTS UNAVAILABLE DUE TO INTERFERING SUBSTANCE 34.3* 33.7*  MCV 87.6 RESULTS UNAVAILABLE DUE TO INTERFERING SUBSTANCE 87.5 90.6  PLT 418* 317 251 251   Cardiac Enzymes: No results for input(s): CKTOTAL, CKMB, CKMBINDEX, TROPONINI in the last 168 hours. CBG: No results for input(s): GLUCAP in the last 168 hours.  Iron Studies: No results for input(s): IRON, TIBC, TRANSFERRIN, FERRITIN in the last 72 hours. Studies/Results: Ct Head Wo Contrast  Result Date: 08/21/2019 CLINICAL DATA:  Headache, acute, normal neuro exam. Additional history provided: Nausea and vomiting for past several days. EXAM: CT HEAD WITHOUT CONTRAST TECHNIQUE: Contiguous axial images were obtained from the base of the skull through the vertex without intravenous contrast. COMPARISON:  No pertinent prior studies available for comparison. FINDINGS: Brain: No evidence of acute intracranial hemorrhage. No demarcated cortical infarction. No evidence of intracranial mass. No midline shift or extra-axial fluid collection. Cerebral volume is normal for age. Vascular: No hyperdense vessel. Skull: Normal. Negative for fracture or focal lesion. Sinuses/Orbits: Visualized orbits demonstrate no acute abnormality. Visualized paranasal sinuses are clear. Visualized mastoid air cells are clear. IMPRESSION: No  evidence of acute intracranial abnormality. Electronically Signed   By:  Simmering   On: 08/21/2019 15:05   Dg Chest Port 1 View  Result Date: 08/21/2019 CLINICAL DATA:  Nausea and vomiting, COPD EXAM: PORTABLE CHEST 1 VIEW COMPARISON:  02/23/2015 FINDINGS: Cardiomegaly. Both lungs are clear. The visualized skeletal structures are unremarkable. IMPRESSION: Cardiomegaly without acute abnormality of the lungs on rotated AP portable examination. Electronically Signed   By: Eddie Candle M.D.   On: 08/21/2019 13:34   Medications: Infusions: . sodium chloride (hypertonic) 40 mL/hr at 08/23/19 0071    Scheduled Medications: . albuterol  2.5 mg Nebulization Q4H  . Chlorhexidine Gluconate Cloth  6 each Topical Daily  . enoxaparin (LOVENOX) injection  40 mg Subcutaneous Q24H  . umeclidinium bromide  1 puff Inhalation Daily   And  . fluticasone furoate-vilanterol  1 puff Inhalation Daily  . nicotine  21 mg Transdermal Daily  . pantoprazole  40 mg Oral Daily    have reviewed scheduled and prn medications.  Physical Exam: General: arouses when called but falls asleep quickly- dont know baseline MS Heart: RRR Lungs: mostly clear Abdomen: soft , non tender Extremities: no signif edema    08/23/2019,7:52 AM  LOS: 2 days

## 2019-08-23 NOTE — Progress Notes (Signed)
PT was very agitated and restless all shift. Redirected many times. PT and significant other feel it is due to not having her depression medication. PT has had many episodes of bladder and bowel incontinence. Xanax has very little effect.

## 2019-08-24 LAB — COMPREHENSIVE METABOLIC PANEL
ALT: 14 U/L (ref 0–44)
AST: 17 U/L (ref 15–41)
Albumin: 2.9 g/dL — ABNORMAL LOW (ref 3.5–5.0)
Alkaline Phosphatase: 69 U/L (ref 38–126)
Anion gap: 9 (ref 5–15)
BUN: <5 mg/dL — ABNORMAL LOW (ref 6–20)
CO2: 23 mmol/L (ref 22–32)
Calcium: 8.1 mg/dL — ABNORMAL LOW (ref 8.9–10.3)
Chloride: 103 mmol/L (ref 98–111)
Creatinine, Ser: 0.58 mg/dL (ref 0.44–1.00)
GFR calc Af Amer: >60 mL/min (ref >60)
GFR calc non Af Amer: >60 mL/min (ref >60)
Glucose, Bld: 130 mg/dL — ABNORMAL HIGH (ref 70–99)
Potassium: 3.3 mmol/L — ABNORMAL LOW (ref 3.5–5.1)
Sodium: 135 mmol/L (ref 135–145)
Total Bilirubin: 0.3 mg/dL (ref 0.3–1.2)
Total Protein: 5.2 g/dL — ABNORMAL LOW (ref 6.5–8.1)

## 2019-08-24 LAB — CBC WITH DIFFERENTIAL/PLATELET
Abs Immature Granulocytes: 0.03 10*3/uL (ref 0.00–0.07)
Basophils Absolute: 0 10*3/uL (ref 0.0–0.1)
Basophils Relative: 0 %
Eosinophils Absolute: 0.1 10*3/uL (ref 0.0–0.5)
Eosinophils Relative: 2 %
HCT: 37.1 % (ref 36.0–46.0)
Hemoglobin: 12.7 g/dL (ref 12.0–15.0)
Immature Granulocytes: 0 %
Lymphocytes Relative: 19 %
Lymphs Abs: 1.6 10*3/uL (ref 0.7–4.0)
MCH: 32.6 pg (ref 26.0–34.0)
MCHC: 34.2 g/dL (ref 30.0–36.0)
MCV: 95.4 fL (ref 80.0–100.0)
Monocytes Absolute: 0.6 10*3/uL (ref 0.1–1.0)
Monocytes Relative: 7 %
Neutro Abs: 6.2 10*3/uL (ref 1.7–7.7)
Neutrophils Relative %: 72 %
Platelets: 277 10*3/uL (ref 150–400)
RBC: 3.89 MIL/uL (ref 3.87–5.11)
RDW: 13.7 % (ref 11.5–15.5)
WBC: 8.6 10*3/uL (ref 4.0–10.5)
nRBC: 0 % (ref 0.0–0.2)

## 2019-08-24 LAB — SODIUM
Sodium: 135 mmol/L (ref 135–145)
Sodium: 134 mmol/L — ABNORMAL LOW (ref 135–145)
Sodium: 135 mmol/L (ref 135–145)

## 2019-08-24 LAB — MAGNESIUM: Magnesium: 2 mg/dL (ref 1.7–2.4)

## 2019-08-24 MED ORDER — SODIUM CHLORIDE 0.9 % IV SOLN: INTRAVENOUS | Status: DC

## 2019-08-24 MED ORDER — POTASSIUM CHLORIDE 20 MEQ PO PACK
40.0000 meq | PACK | Freq: Once | ORAL | Status: AC
  Administered 2019-08-24: 14:00:00 40 meq via ORAL
  Filled 2019-08-24: qty 2

## 2019-08-24 MED ORDER — CITALOPRAM HYDROBROMIDE 20 MG PO TABS
20.0000 mg | ORAL_TABLET | Freq: Every day | ORAL | Status: DC
  Administered 2019-08-25: 09:00:00 20 mg via ORAL
  Filled 2019-08-24: qty 1

## 2019-08-24 NOTE — Progress Notes (Signed)
Admit: 08/21/2019 LOS: 3  Rebecca Liu with AMS and severe hyponatremia  Subjective:  . Na up to 135 . 3% HT Saline stopped yesterday PM . Awake, conversant but partner states still off her baseline . Full strenght in UEs and LEs b/l   Sodium (mmol/L)  Date Value  08/24/2019 135  08/24/2019 135  08/23/2019 131 (L)  08/23/2019 128 (L)  08/23/2019 123 (L)  08/23/2019 120 (L)  08/23/2019 121 (L)  08/23/2019 118 (LL)  08/22/2019 115 (LL)  08/22/2019 115 (LL)  ]  10/06 0701 - 10/07 0700 In: 3259.8 [P.O.:1200; I.V.:2059.8] Out: 550 [Urine:550]  Filed Weights   08/22/19 0500 08/23/19 0500 08/24/19 0500  Weight: 79.3 kg 81.9 kg 80.9 kg    Scheduled Meds: . Chlorhexidine Gluconate Cloth  6 each Topical Daily  . enoxaparin (LOVENOX) injection  40 mg Subcutaneous Q24H  . umeclidinium bromide  1 puff Inhalation Daily   And  . fluticasone furoate-vilanterol  1 puff Inhalation Daily  . nicotine  21 mg Transdermal Daily  . pantoprazole  40 mg Oral Daily  . venlafaxine XR  75 mg Oral Daily   Continuous Infusions: . sodium chloride 100 mL/hr at 08/24/19 0400   PRN Meds:.acetaminophen **OR** acetaminophen, albuterol, ALPRAZolam, ketorolac, ondansetron **OR** ondansetron (ZOFRAN) IV, promethazine  Current Labs: reviewed  Results for KYREN, VAUX (MRN [id number]) as of 08/24/2019 09:52  Ref. Range 08/22/2019 05:27  Osmolality, Urine Latest Ref Range: 300 - 900 mOsm/kg 435  Results for BROOKELYNNE, DIMPERIO (MRN [id number]) as of 08/24/2019 09:52  Ref. Range 08/22/2019 05:26  Sodium, Urine Latest Units: mmol/L 13  Results for BROOKELYNNE, DIMPERIO (MRN [id number]) as of 08/24/2019 09:52  Ref. Range 08/21/2019 11:02  Osmolality Latest Ref Range: 275 - 295 mOsm/kg 229 (LL)    Physical Exam:  Blood pressure 102/62, pulse 83, temperature 97.9 F (36.6 C), temperature source Axillary, resp. rate 14, height 5\' 4"  (1.626 m), weight 80.9 kg, SpO2 98 %. Chronically ill-appearing, awake, conversant but not  appropriate Regular, normal S1 and S2 Clear bilaterally Soft, nontender No peripheral edema Moves all extremities against gravity  A 1. Hypotonic Hyponatremia: Originally felt to have some hypovolemia related to thiazide and potential SIADH related to SSRI.  Has corrected with hypertonic saline. 2. Encephalopathy 3. Hypokalemia  P . Agree with stopping 3% saline . I think she is euvolemic, stop normal saline . Will place moderate fluid restriction of 1.5 L . Continue to follow serum sodium every 6 hours . Would not restart Effexor; apparently she had been on Lexapro for quite some time, presumably doing well with that.  If medication is necessary I would prefer Lexapro be used . Replete K 61mEq po today   Pearson Grippe MD 08/24/2019, 9:51 AM  Recent Labs  Lab 08/22/19 1049  08/23/19 0025 08/23/19 0430  08/23/19 2212 08/24/19 0432 08/24/19 0855  NA 113*   < > 118* 121*  120*   < > 131* 135 135  K 3.1*   < > 3.6 3.9  --   --  3.3*  --   CL 79*  --   --  90*  --   --  103  --   CO2 25  --   --  24  --   --  23  --   GLUCOSE 101*  --   --  94  --   --  130*  --   BUN 9  --   --  8  --   --  <  5*  --   CREATININE 0.44  --   --  0.40*  --   --  0.58  --   CALCIUM 7.4*  --   --  7.5*  --   --  8.1*  --    < > = values in this interval not displayed.   Recent Labs  Lab 08/22/19 0229 08/23/19 0430 08/24/19 0432  WBC 8.0 7.2 8.6  NEUTROABS 5.9 5.2 6.2  HGB 12.8 12.1 12.7  HCT 34.3* 33.7* 37.1  MCV 87.5 90.6 95.4  PLT 251 251 277

## 2019-08-24 NOTE — Progress Notes (Signed)
PROGRESS NOTE  Rebecca Liu YNW:[id number] DOB: [birth year] DOA: 08/21/2019 PCP: Ardell Isaacs, NP  Brief History:  53 y.o.femalesmoker with COPD, hypertension, depression taking hydrochlorothiazide 25 mg daily presented with nausea and vomiting for the past several days. She presented to [facility] emergency department on 08/19/2019 because she was sent due to having abnormal labs. She ended up leaving without being seen because of the long wait time. She presented to [facility] ED 08/21/19 complaining of 4 to 5 days of nausea vomiting and malaise. EMS gave her 4 mg of Zofran prior to arrival. She denies having abdominal pain. She denies diarrhea. Her sodium was noted to be 114 at [facility] on 08/19/2019. When she arrived at [facility] her labs were retested and her sodium was noted to be 112. Her potassium was 2.9.  Her glucose was elevated at 134. Her white blood cell count was elevated at 14.3. Her platelet count was 317.  Nephrology was consulted and patient was started on hypertonic saline.  Serial Na was monitored q 4 hours.  Assessment/Plan: Severe hyponatremia -- patient was initially hypovolemic likely from the diuretics and now started on hypertonic saline solution due to mental status changes. --continue serial Na q 4 hours  -Sodium levels are improving.   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   -Appreciate the assistance of the nephrology service.  -d/c IVF and monitor Na -discussed with pt and significant other about stopping effexor altogether which they agreed.  Restarted celexa as therapeutic interchange for lexapro  Acute metabolic Encephalopathy -due to low Na -continues to improve but not at baseline  -check B12 -check folate -TSH 1.264 -check ammonia   Hypokalemia/hypomagnesemia --replaced.   Leukocytosis --due to stress demargination -improved  Essential hypertension --discontinued hydrochlorothiazide --BP remains stable   COPD/Tobacco abuse --patient is a current smoker and has been counseled to stop smoking.  Continue home bronchodilators and supplemental oxygen if needed. --stable on RA  Intractable Nausea vomiting -- improved, tolerating regular diet  Hyperglycemia/Impaired glucose tolerance -- A1c 5.9%. --lifestyle mofication       Disposition Plan:   Home 10/8 or 10/9 if Na stable and cleared by renal Family Communication:   Significant other updated at bedside 10/7  Consultants:  renal  Code Status:  FULL   DVT Prophylaxis:  [place] Lovenox   Procedures: As Listed in Progress Note Above  Antibiotics: None      Subjective: Patient denies fevers, chills, headache, chest pain, dyspnea, nausea, vomiting, diarrhea, abdominal pain, dysuria, hematuria,    Objective: Vitals:   08/24/19 0857 08/24/19 1300 08/24/19 1400 08/24/19 1600  BP:      Pulse:  86    Resp:  Temp: 97.9 F (36.6 C)     TempSrc: Axillary     SpO2: 98% 98%    Weight:      Height:        Intake/Output Summary (Last 24 hours) at 08/24/2019 1836 Last data filed at 08/24/2019 0857 Gross per 24 hour  Intake 990.09 ml  Output 1200 ml  Net -209.91 ml   Weight change: -1 kg Exam:   General:  Pt is alert, follows commands appropriately, not in acute distress  HEENT: No icterus, No thrush, No neck mass, [place]/AT  Cardiovascular: RRR, S1/S2, no rubs, no gallops  Respiratory: diminished breath sounds. No wheeze.  Bibasilar rales  Abdomen: Soft/+BS, non tender, non distended, no guarding  Extremities: No edema, No lymphangitis, No petechiae, No rashes, no synovitis   Data Reviewed: I have personally reviewed following labs and imaging studies Basic Metabolic Panel: Recent Labs  Lab 08/21/19 1102 08/21/19 1327  08/22/19 0229  08/22/19 1049  08/22/19 1945 08/23/19 0025 08/23/19 0430  08/23/19 1352 08/23/19 2212 08/24/19 0432 08/24/19 0855 08/24/19 1442  NA  --  111*   < > 114*   < >  113*   < > 115* 118* 121*  120*   < > 128* 131* 135 135 134*  K  --  2.3*   < > 2.9*   < > 3.1*  --  3.4* 3.6 3.9  --   --   --  3.3*  --   --   CL  --  67*  --  76*  --  79*  --   --   --  90*  --   --   --  103  --   --   CO2  --  30  --  27  --  25  --   --   --  24  --   --   --  23  --   --   GLUCOSE  --  145*  --  92  --  101*  --   --   --  94  --   --   --  130*  --   --   BUN  --  7  --  8  --  9  --   --   --  8  --   --   --  <5*  --   --   CREATININE  --  0.48  --  0.45  --  0.44  --   --   --  0.40*  --   --   --  0.58  --   --   CALCIUM  --  8.8*  --  7.6*  --  7.4*  --   --   --  7.5*  --   --   --  8.1*  --   --   MG 1.3*  --   --  1.6*  --   --   --   --   --  1.9  --   --   --  2.0  --   --    < > = values in this interval not displayed.   Liver Function Tests: Recent Labs  Lab 08/19/19 2242 08/21/19 1058 08/22/19 0229 08/23/19 0430 08/24/19 0432  AST 13* 14* 16 13* 17  ALT 11 12 15 15 14   ALKPHOS 105 95 77 70 69  BILITOT 0.9 1.0 1.0 0.6 0.3  PROT 7.5 6.9 5.6* 5.2* 5.2*  ALBUMIN 4.5 4.1 3.3* 3.0* 2.9*   Recent Labs  Lab 08/19/19 2242 08/21/19 1058  LIPASE 26 30   No results for input(s): AMMONIA in the last 168 hours. Coagulation Profile: No results for input(s): INR, PROTIME in the last 168 hours. CBC: Recent Labs  Lab 08/19/19 2242 08/21/19 1058 08/22/19 0229 08/23/19 0430 08/24/19 0432  WBC 13.3* 14.3* 8.0 7.2 8.6  NEUTROABS  --  12.4* 5.9 5.2 6.2  HGB 15.9* 15.5* 12.8 12.1 12.7  HCT 42.5 RESULTS UNAVAILABLE DUE TO INTERFERING SUBSTANCE 34.3* 33.7* 37.1  MCV 87.6 RESULTS UNAVAILABLE DUE TO INTERFERING SUBSTANCE 87.5 90.6 95.4  PLT 418* 317 251 251 277   Cardiac Enzymes: No results  for input(s): CKTOTAL, CKMB, CKMBINDEX, TROPONINI in the last 168 hours. BNP: Invalid input(s): POCBNP CBG: No results for input(s): GLUCAP in the last 168 hours. HbA1C: No results for input(s): HGBA1C in the last 72 hours. Urine analysis:    Component Value  Date/Time   COLORURINE YELLOW 08/22/2019 0526   APPEARANCEUR TURBID (A) 08/22/2019 0526   LABSPEC 1.017 08/22/2019 0526   PHURINE 5.0 08/22/2019 0526   GLUCOSEU NEGATIVE 08/22/2019 0526   HGBUR NEGATIVE 08/22/2019 0526   BILIRUBINUR NEGATIVE 08/22/2019 0526   KETONESUR 5 (A) 08/22/2019 0526   PROTEINUR 30 (A) 08/22/2019 0526   NITRITE NEGATIVE 08/22/2019 0526   LEUKOCYTESUR NEGATIVE 08/22/2019 0526   Sepsis Labs: @LABRCNTIP (procalcitonin:4,lacticidven:4) ) Recent Results (from the past 240 hour(s))  SARS CORONAVIRUS 2 (Cade Olberding 6-24 HRS) Nasopharyngeal Nasopharyngeal Swab     Status: None   Collection Time: 08/21/19  1:20 PM   Specimen: Nasopharyngeal Swab  Result Value Ref Range Status   SARS Coronavirus 2 NEGATIVE NEGATIVE Final    Comment: (NOTE) SARS-CoV-2 target nucleic acids are NOT DETECTED. The SARS-CoV-2 RNA is generally detectable in upper and lower respiratory specimens during the acute phase of infection. Negative results do not preclude SARS-CoV-2 infection, do not rule out co-infections with other pathogens, and should not be used as the sole basis for treatment or other patient management decisions. Negative results must be combined with clinical observations, patient history, and epidemiological information. The expected result is Negative. Fact Sheet for Patients: 10/21/19 Fact Sheet for Healthcare Providers: HairSlick.no This test is not yet approved or cleared by the quierodirigir.com FDA and  has been authorized for detection and/or diagnosis of SARS-CoV-2 by FDA under an Emergency Use Authorization (EUA). This EUA will remain  in effect (meaning this test can be used) for the duration of the COVID-19 declaration under Section 56 4(b)(1) of the Act, 21 U.S.C. section 360bbb-3(b)(1), unless the authorization is terminated or revoked sooner. Performed at [facility], [address]  [place], [place]   MRSA PCR Screening     Status: None   Collection Time: 08/21/19  3:21 PM   Specimen: Nasopharyngeal  Result Value Ref Range Status   MRSA by PCR NEGATIVE NEGATIVE Final    Comment:        The GeneXpert MRSA Assay (FDA approved for NASAL specimens only), is one component of a comprehensive MRSA colonization surveillance program. It is not intended to diagnose MRSA infection nor to guide or monitor treatment for MRSA infections. Performed at [facility], [address], [place], [place]   Culture, Urine     Status: None   Collection Time: 08/22/19  5:30 AM   Specimen: Urine, Clean Catch  Result Value Ref Range Status   Specimen Description   Final    URINE, CLEAN CATCH Performed at [facility], [address], [place], [place]    Special Requests   Final    NONE Performed at [facility], [address] [place], [place]    Culture   Final    NO GROWTH Performed at [facility], [address], [place], [place]    Report Status 08/23/2019 FINAL  Final     Scheduled Meds: . Chlorhexidine Gluconate Cloth  6 each Topical Daily  . [START ON 08/25/2019] citalopram  20 mg Oral Daily  . enoxaparin (LOVENOX) injection  40 mg Subcutaneous Q24H  . umeclidinium bromide  1 puff Inhalation Daily  And  . fluticasone furoate-vilanterol  1 puff Inhalation Daily  . nicotine  21 mg Transdermal Daily  . pantoprazole  40 mg Oral Daily   Continuous Infusions:  Procedures/Studies: Ct Head Wo Contrast  Result Date: 08/21/2019 CLINICAL DATA:  Headache, acute, normal neuro exam. Additional history provided: Nausea and vomiting for past several days. EXAM: CT HEAD WITHOUT CONTRAST TECHNIQUE: Contiguous axial images were obtained from the base of the skull through the vertex without intravenous contrast. COMPARISON:  No pertinent prior studies available for comparison. FINDINGS: Brain: No evidence of acute intracranial  hemorrhage. No demarcated cortical infarction. No evidence of intracranial mass. No midline shift or extra-axial fluid collection. Cerebral volume is normal for age. Vascular: No hyperdense vessel. Skull: Normal. Negative for fracture or focal lesion. Sinuses/Orbits: Visualized orbits demonstrate no acute abnormality. Visualized paranasal sinuses are clear. Visualized mastoid air cells are clear. IMPRESSION: No evidence of acute intracranial abnormality. Electronically Signed   By: Jackey LogeKyle  Golden   On: 08/21/2019 15:05   Dg Chest Port 1 View  Result Date: 08/21/2019 CLINICAL DATA:  Nausea and vomiting, COPD EXAM: PORTABLE CHEST 1 VIEW COMPARISON:  02/23/2015 FINDINGS: Cardiomegaly. Both lungs are clear. The visualized skeletal structures are unremarkable. IMPRESSION: Cardiomegaly without acute abnormality of the lungs on rotated AP portable examination. Electronically Signed   By: Lauralyn PrimesAlex  Bibbey M.D.   On: 08/21/2019 13:34    Catarina Hartshornavid Alain Deschene, DO  [facility] Pager [phone]  If 7PM-7AM, please contact night-coverage [email or web address] Password [email or web address] 08/24/2019, 6:36 PM   LOS: 3 days

## 2019-08-25 LAB — CBC WITH DIFFERENTIAL/PLATELET
Abs Immature Granulocytes: 0.03 10*3/uL (ref 0.00–0.07)
Basophils Absolute: 0 10*3/uL (ref 0.0–0.1)
Basophils Relative: 0 %
Eosinophils Absolute: 0.2 10*3/uL (ref 0.0–0.5)
Eosinophils Relative: 2 %
HCT: 36.5 % (ref 36.0–46.0)
Hemoglobin: 12.3 g/dL (ref 12.0–15.0)
Immature Granulocytes: 0 %
Lymphocytes Relative: 27 %
Lymphs Abs: 2.2 10*3/uL (ref 0.7–4.0)
MCH: 32.5 pg (ref 26.0–34.0)
MCHC: 33.7 g/dL (ref 30.0–36.0)
MCV: 96.6 fL (ref 80.0–100.0)
Monocytes Absolute: 0.5 10*3/uL (ref 0.1–1.0)
Monocytes Relative: 6 %
Neutro Abs: 5.3 10*3/uL (ref 1.7–7.7)
Neutrophils Relative %: 65 %
Platelets: 278 10*3/uL (ref 150–400)
RBC: 3.78 MIL/uL — ABNORMAL LOW (ref 3.87–5.11)
RDW: 13.9 % (ref 11.5–15.5)
WBC: 8.2 10*3/uL (ref 4.0–10.5)
nRBC: 0 % (ref 0.0–0.2)

## 2019-08-25 LAB — COMPREHENSIVE METABOLIC PANEL
ALT: 15 U/L (ref 0–44)
AST: 10 U/L — ABNORMAL LOW (ref 15–41)
Albumin: 3 g/dL — ABNORMAL LOW (ref 3.5–5.0)
Alkaline Phosphatase: 84 U/L (ref 38–126)
Anion gap: 5 (ref 5–15)
BUN: <5 mg/dL — ABNORMAL LOW (ref 6–20)
CO2: 26 mmol/L (ref 22–32)
Calcium: 8.4 mg/dL — ABNORMAL LOW (ref 8.9–10.3)
Chloride: 104 mmol/L (ref 98–111)
Creatinine, Ser: 0.54 mg/dL (ref 0.44–1.00)
GFR calc Af Amer: >60 mL/min (ref >60)
GFR calc non Af Amer: >60 mL/min (ref >60)
Glucose, Bld: 128 mg/dL — ABNORMAL HIGH (ref 70–99)
Potassium: 4.2 mmol/L (ref 3.5–5.1)
Sodium: 135 mmol/L (ref 135–145)
Total Bilirubin: 0.3 mg/dL (ref 0.3–1.2)
Total Protein: 5.7 g/dL — ABNORMAL LOW (ref 6.5–8.1)

## 2019-08-25 LAB — SODIUM: Sodium: 135 mmol/L (ref 135–145)

## 2019-08-25 LAB — MAGNESIUM: Magnesium: 1.7 mg/dL (ref 1.7–2.4)

## 2019-08-25 MED ORDER — ESCITALOPRAM OXALATE 10 MG PO TABS: 10.0000 mg | ORAL_TABLET | Freq: Every day | ORAL | 1 refills | Status: DC

## 2019-08-25 MED ORDER — MAGNESIUM SULFATE 2 GM/50ML IV SOLN
2.0000 g | Freq: Once | INTRAVENOUS | Status: AC
  Administered 2019-08-25: 09:00:00 2 g via INTRAVENOUS
  Filled 2019-08-25: qty 50

## 2019-08-25 NOTE — Discharge Summary (Signed)
Physician Discharge Summary  Rebecca Liu TFT:[id number] DOB: [birth year] DOA: 08/21/2019  PCP: Ardell Isaacs, NP  Admit date: 08/21/2019 Discharge date: 08/25/2019  Admitted From: Home Disposition:  Home   Recommendations for Outpatient Follow-up:  1. Follow up with PCP in 1-2 weeks 2. Please obtain BMP in one week   Discharge Condition: Stable CODE STATUS: FULL Diet recommendation: regular   Brief/Interim Summary: 53 y.o.femalesmoker with COPD, hypertension, depression taking hydrochlorothiazide 25 mg daily presented with nausea and vomiting for the past several days. She presented to [facility] emergency department on 08/19/2019 because she was sent due to having abnormal labs. She ended up leaving without being seen because of the long wait time. She presented to [facility] ED 08/21/19 complaining of 4 to 5 days of nausea vomiting and malaise. EMS gave her 4 mg of Zofran prior to arrival. She denies having abdominal pain. She denies diarrhea. Her sodium was noted to be 114 at [facility] on 08/19/2019. When she arrived at [facility] her labs were retested and her sodium was noted to be 112. Her potassium was 2.9.  Her glucose was elevated at 134. Her white blood cell count was elevated at 14.3. Her platelet count was 317.  Nephrology was consulted and patient was started on hypertonic saline.  Serial Na was monitored q 4 hours.  Ultimately, her Na improved and stabilized.  All her fluids were stopped and Na remained stable.  Na = 135 on day of d/c.  She will need repeat BMP in one week after d/c.  Discharge Diagnoses:  Severe hyponatremia --patient was initially hypovolemic likely from the diuretics and now started on hypertonic saline solution due to mental status changes.  ---continue serial Na q 4 hours -Sodium levels are improving.  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 -Appreciate the assistance of the nephrology service.  -d/c IVF and monitor  Na -discussed with pt and significant other about stopping effexor altogether which they agreed.  Restarted celexa as therapeutic interchange for lexapro -d/c home with lexapro -Na = 135 on day of d/c -discussed with renal-->ok for d/c  Acute metabolic Encephalopathy -due to low Na -continues to improve  -near baseline per significant other -TSH 1.264  Hypokalemia/hypomagnesemia --replaced.   Leukocytosis --due to stress demargination -improved  Essential hypertension --discontinued hydrochlorothiazide --BP remains stable  COPD/Tobacco abuse --patient is a current smoker and has been counseled to stop smoking. Continue home bronchodilators and supplemental oxygen if needed. --stable on RA  Intractable Nausea vomiting --improved, tolerating regular diet  Hyperglycemia/Impaired glucose tolerance -- A1c5.9%. --lifestyle mofication   Discharge Instructions   Allergies as of 08/25/2019      Reactions   Wellbutrin [bupropion]       Medication List    STOP taking these medications   hydrochlorothiazide 25 MG tablet Commonly known as: HYDRODIURIL   venlafaxine XR 75 MG 24 hr capsule Commonly known as: EFFEXOR-XR     TAKE these medications   albuterol 108 (90 Base) MCG/ACT inhaler Commonly known as: VENTOLIN HFA Inhale 2 puffs into the lungs every 4 (four) hours.   ALPRAZolam 0.5 MG tablet Commonly known as: XANAX Take 1 tablet by mouth daily.   escitalopram 10 MG tablet Commonly known as: Lexapro Take 1 tablet (10 mg total) by mouth daily.   Trelegy Ellipta 100-62.5-25 MCG/INH Aepb Generic drug: Fluticasone-Umeclidin-Vilant Take 1 puff by mouth daily.       Allergies  Allergen Reactions  . Wellbutrin [Bupropion]  Consultations:  renal   Procedures/Studies: Ct Head Wo Contrast  Result Date: 08/21/2019 CLINICAL DATA:  Headache, acute, normal neuro exam. Additional history provided: Nausea and vomiting for past several days. EXAM:  CT HEAD WITHOUT CONTRAST TECHNIQUE: Contiguous axial images were obtained from the base of the skull through the vertex without intravenous contrast. COMPARISON:  No pertinent prior studies available for comparison. FINDINGS: Brain: No evidence of acute intracranial hemorrhage. No demarcated cortical infarction. No evidence of intracranial mass. No midline shift or extra-axial fluid collection. Cerebral volume is normal for age. Vascular: No hyperdense vessel. Skull: Normal. Negative for fracture or focal lesion. Sinuses/Orbits: Visualized orbits demonstrate no acute abnormality. Visualized paranasal sinuses are clear. Visualized mastoid air cells are clear. IMPRESSION: No evidence of acute intracranial abnormality. Electronically Signed   By: Jackey Loge   On: 08/21/2019 15:05   Dg Chest Port 1 View  Result Date: 08/21/2019 CLINICAL DATA:  Nausea and vomiting, COPD EXAM: PORTABLE CHEST 1 VIEW COMPARISON:  02/23/2015 FINDINGS: Cardiomegaly. Both lungs are clear. The visualized skeletal structures are unremarkable. IMPRESSION: Cardiomegaly without acute abnormality of the lungs on rotated AP portable examination. Electronically Signed   By: Lauralyn Primes M.D.   On: 08/21/2019 13:34        Discharge Exam: Vitals:   08/24/19 2031 08/25/19 0503  BP: 139/84 135/87  Pulse: 76 86  Resp: 16 16  Temp:  98.6 F (37 C)  SpO2: 100% 90%   Vitals:   08/24/19 1400 08/24/19 1600 08/24/19 2031 08/25/19 0503  BP:   139/84 135/87  Pulse:   76 86  Resp: Temp:    98.6 F (37 C)  TempSrc:    Oral  SpO2:   100% 90%  Weight:    79.6 kg  Height:        General: Pt is alert, awake, not in acute distress Cardiovascular: RRR, S1/S2 +, no rubs, no gallops Respiratory: diminished breath sounds but CTA bilaterally, no wheezing, no rhonchi Abdominal: Soft, NT, ND, bowel sounds + Extremities: no edema, no cyanosis   The results of significant diagnostics from this hospitalization (including  imaging, microbiology, ancillary and laboratory) are listed below for reference.    Significant Diagnostic Studies: Ct Head Wo Contrast  Result Date: 08/21/2019 CLINICAL DATA:  Headache, acute, normal neuro exam. Additional history provided: Nausea and vomiting for past several days. EXAM: CT HEAD WITHOUT CONTRAST TECHNIQUE: Contiguous axial images were obtained from the base of the skull through the vertex without intravenous contrast. COMPARISON:  No pertinent prior studies available for comparison. FINDINGS: Brain: No evidence of acute intracranial hemorrhage. No demarcated cortical infarction. No evidence of intracranial mass. No midline shift or extra-axial fluid collection. Cerebral volume is normal for age. Vascular: No hyperdense vessel. Skull: Normal. Negative for fracture or focal lesion. Sinuses/Orbits: Visualized orbits demonstrate no acute abnormality. Visualized paranasal sinuses are clear. Visualized mastoid air cells are clear. IMPRESSION: No evidence of acute intracranial abnormality. Electronically Signed   By: Jackey Loge   On: 08/21/2019 15:05   Dg Chest Port 1 View  Result Date: 08/21/2019 CLINICAL DATA:  Nausea and vomiting, COPD EXAM: PORTABLE CHEST 1 VIEW COMPARISON:  02/23/2015 FINDINGS: Cardiomegaly. Both lungs are clear. The visualized skeletal structures are unremarkable. IMPRESSION: Cardiomegaly without acute abnormality of the lungs on rotated AP portable examination. Electronically Signed   By: Lauralyn Primes M.D.   On: 08/21/2019 13:34     Microbiology: Recent Results (from the past 240 hour(s))  SARS CORONAVIRUS 2 (Ambyr Qadri 6-24 HRS) Nasopharyngeal Nasopharyngeal Swab     Status: None   Collection Time: 08/21/19  1:20 PM   Specimen: Nasopharyngeal Swab  Result Value Ref Range Status   SARS Coronavirus 2 NEGATIVE NEGATIVE Final    Comment: (NOTE) SARS-CoV-2 target nucleic acids are NOT DETECTED. The SARS-CoV-2 RNA is generally detectable in upper and lower respiratory  specimens during the acute phase of infection. Negative results do not preclude SARS-CoV-2 infection, do not rule out co-infections with other pathogens, and should not be used as the sole basis for treatment or other patient management decisions. Negative results must be combined with clinical observations, patient history, and epidemiological information. The expected result is Negative. Fact Sheet for Patients: HairSlick.nohttps://www.fda.gov/media/138098/download Fact Sheet for Healthcare Providers: quierodirigir.comhttps://www.fda.gov/media/138095/download This test is not yet approved or cleared by the [place] FDA and  has been authorized for detection and/or diagnosis of SARS-CoV-2 by FDA under an Emergency Use Authorization (EUA). This EUA will remain  in effect (meaning this test can be used) for the duration of the COVID-19 declaration under Section 56 4(b)(1) of the Act, 21 U.S.C. section 360bbb-3(b)(1), unless the authorization is terminated or revoked sooner. Performed at [facility], [address] [place], [place] [address]   MRSA PCR Screening     Status: None   Collection Time: 08/21/19  3:21 PM   Specimen: Nasopharyngeal  Result Value Ref Range Status   MRSA by PCR NEGATIVE NEGATIVE Final    Comment:        The GeneXpert MRSA Assay (FDA approved for NASAL specimens only), is one component of a comprehensive MRSA colonization surveillance program. It is not intended to diagnose MRSA infection nor to guide or monitor treatment for MRSA infections. Performed at [facility], [address] [place], [place] [address]   Culture, Urine     Status: None   Collection Time: 08/22/19  5:30 AM   Specimen: Urine, Clean Catch  Result Value Ref Range Status   Specimen Description   Final    URINE, CLEAN CATCH Performed at [facility], [address] [place], [place] [address]    Special Requests   Final    NONE Performed at [facility], [address] [place], [place]  [address]    Culture   Final    NO GROWTH Performed at [facility], [address] [place], [place] [address]    Report Status 08/23/2019 FINAL  Final     Labs: Basic Metabolic Panel: Recent Labs  Lab 08/21/19 1102  08/22/19 0229  08/22/19 1049  08/23/19 0430  08/24/19 0432 08/24/19 0855 08/24/19 1442 08/24/19 2000 08/25/19 0222 08/25/19 0809  NA  --    < > 114*   < > 113*   < > 121*  120*   < > 135 135 134* 135 135 135  K  --    < > 2.9*   < > 3.1*   < > 3.9  --  3.3*  --   --   --  4.2  --   CL  --    < > 76*  --  79*  --  90*  --  103  --   --   --  104  --   CO2  --    < > 27  --  25  --  24  --  23  --   --   --  26  --  GLUCOSE  --    < > 92  --  101*  --  94  --  130*  --   --   --  128*  --   BUN  --    < > 8  --  9  --  8  --  <5*  --   --   --  <5*  --   CREATININE  --    < > 0.45  --  0.44  --  0.40*  --  0.58  --   --   --  0.54  --   CALCIUM  --    < > 7.6*  --  7.4*  --  7.5*  --  8.1*  --   --   --  8.4*  --   MG 1.3*  --  1.6*  --   --   --  1.9  --  2.0  --   --   --  1.7  --    < > = values in this interval not displayed.   Liver Function Tests: Recent Labs  Lab 08/21/19 1058 08/22/19 0229 08/23/19 0430 08/24/19 0432 08/25/19 0222  AST 14* 16 13* 17 10*  ALT 12 15 15 14 15   ALKPHOS 95 77 70 69 84  BILITOT 1.0 1.0 0.6 0.3 0.3  PROT 6.9 5.6* 5.2* 5.2* 5.7*  ALBUMIN 4.1 3.3* 3.0* 2.9* 3.0*   Recent Labs  Lab 08/19/19 2242 08/21/19 1058  LIPASE 26 30   No results for input(s): AMMONIA in the last 168 hours. CBC: Recent Labs  Lab 08/21/19 1058 08/22/19 0229 08/23/19 0430 08/24/19 0432 08/25/19 0222  WBC 14.3* 8.0 7.2 8.6 8.2  NEUTROABS 12.4* 5.9 5.2 6.2 5.3  HGB 15.5* 12.8 12.1 12.7 12.3  HCT RESULTS UNAVAILABLE DUE TO INTERFERING SUBSTANCE 34.3* 33.7* 37.1 36.5  MCV RESULTS UNAVAILABLE DUE TO INTERFERING SUBSTANCE 87.5 90.6 95.4 96.6  PLT 317 251 251 277 278   Cardiac Enzymes: No results for input(s): CKTOTAL, CKMB,  CKMBINDEX, TROPONINI in the last 168 hours. BNP: Invalid input(s): POCBNP CBG: No results for input(s): GLUCAP in the last 168 hours.  Time coordinating discharge:  36 minutes  Signed:  Orson Eva, DO [facility] Pager: [phone] 08/25/2019, 9:15 AM

## 2019-08-25 NOTE — Progress Notes (Signed)
Nsg Discharge Note  Admit Date:  08/21/2019 Discharge date: 08/25/2019   Rebecca Liu to be D/C'd Home per MD order.  AVS completed.  Copy for chart, and copy for patient signed, and dated. Patient/caregiver able to verbalize understanding.  Discharge Medication: Allergies as of 08/25/2019      Reactions   Wellbutrin [bupropion]       Medication List    STOP taking these medications   hydrochlorothiazide 25 MG tablet Commonly known as: HYDRODIURIL   venlafaxine XR 75 MG 24 hr capsule Commonly known as: EFFEXOR-XR     TAKE these medications   albuterol 108 (90 Base) MCG/ACT inhaler Commonly known as: VENTOLIN HFA Inhale 2 puffs into the lungs every 4 (four) hours.   ALPRAZolam 0.5 MG tablet Commonly known as: XANAX Take 1 tablet by mouth daily.   escitalopram 10 MG tablet Commonly known as: Lexapro Take 1 tablet (10 mg total) by mouth daily.   Trelegy Ellipta 100-62.5-25 MCG/INH Aepb Generic drug: Fluticasone-Umeclidin-Vilant Take 1 puff by mouth daily.       Discharge Assessment: Vitals:   08/24/19 2031 08/25/19 0503  BP: 139/84 135/87  Pulse: 76 86  Resp: 16 16  Temp:  98.6 F (37 C)  SpO2: 100% 90%   Skin clean, dry and intact without evidence of skin break down, no evidence of skin tears noted. IV catheter discontinued intact. Site without signs and symptoms of complications - no redness or edema noted at insertion site, patient denies c/o pain - only slight tenderness at site.  Dressing with slight pressure applied.  D/c Instructions-Education: Discharge instructions given to patient/family with verbalized understanding. D/c education completed with patient/family including follow up instructions, medication list, d/c activities limitations if indicated, with other d/c instructions as indicated by MD - patient able to verbalize understanding, all questions fully answered. Patient instructed to return to ED, call 911, or call MD for any changes in  condition.  Patient escorted via [place], and D/C home via private auto.  Carney Corners, RN 08/25/2019 11:39 AM

## 2019-08-25 NOTE — Progress Notes (Signed)
Admit: 08/21/2019 LOS: 4  26F with AMS and severe hyponatremia  Subjective:  . Na stable at 135 . Ambulating in halls, feels better, mental status is improving . Patient and partner both desire going home today . Restarted Celexa for mood . 1.2 L urine output . They tell me she was drinking copious amounts of water and eating very little  Sodium (mmol/L)  Date Value  08/25/2019 135  08/25/2019 135  08/24/2019 135  08/24/2019 134 (L)  08/24/2019 135  08/24/2019 135  08/23/2019 131 (L)  08/23/2019 128 (L)  08/23/2019 123 (L)  08/23/2019 120 (L)  08/23/2019 121 (L)  ]  10/07 0701 - 10/08 0700 In: -  Out: 1200 [Urine:1200]  Filed Weights   08/23/19 0500 08/24/19 0500 08/25/19 0503  Weight: 81.9 kg 80.9 kg 79.6 kg    Scheduled Meds: . Chlorhexidine Gluconate Cloth  6 each Topical Daily  . citalopram  20 mg Oral Daily  . enoxaparin (LOVENOX) injection  40 mg Subcutaneous Q24H  . umeclidinium bromide  1 puff Inhalation Daily   And  . fluticasone furoate-vilanterol  1 puff Inhalation Daily  . nicotine  21 mg Transdermal Daily  . pantoprazole  40 mg Oral Daily   Continuous Infusions: . magnesium sulfate bolus IVPB 2 g (08/25/19 0924)   PRN Meds:.acetaminophen **OR** acetaminophen, albuterol, ALPRAZolam, ketorolac, ondansetron **OR** ondansetron (ZOFRAN) IV, promethazine  Current Labs: reviewed  Results for JUDEE, HENNICK (MRN [id number]) as of 08/24/2019 09:52  Ref. Range 08/22/2019 05:27  Osmolality, Urine Latest Ref Range: 300 - 900 mOsm/kg 435  Results for LAKYNN, HALVORSEN (MRN [id number]) as of 08/24/2019 09:52  Ref. Range 08/22/2019 05:26  Sodium, Urine Latest Units: mmol/L 13  Results for SHERVON, KERWIN (MRN [id number]) as of 08/24/2019 09:52  Ref. Range 08/21/2019 11:02  Osmolality Latest Ref Range: 275 - 295 mOsm/kg 229 (LL)    Physical Exam:  Blood pressure 135/87, pulse 86, temperature 98.6 F (37 C), temperature source Oral, resp. rate 16, height 5\' 4"   (1.626 m), weight 79.6 kg, SpO2 90 %. Chronically ill-appearing, awake, conversant but not appropriate Regular, normal S1 and S2 Clear bilaterally Soft, nontender No peripheral edema Moves all extremities against gravity  A 1. Hypotonic Hyponatremia: Originally felt to have some hypovolemia related to thiazide and potential SIADH related to SSRI.  Has corrected with hypertonic saline.  Given her dietary recall, could also have had some degree of tea and toast affect.  Clearly improved, asymptomatic. 2. Encephalopathy, resolving 3. Hypokalemia, resolved  P . Patient is stable for discharge . Do not resume thiazide diuretic . Discussed importance of adequate solute intake in the form of protein . Patient should let her thirst dictate her beverage intake; does not need to push oral fluids; target should be about 1.5 quarts total fluids a day . Will need PMD to follow-up labs within 7 days time . No immediate follow-up is required with nephrology, my card was given to them in case serum sodium values fall again   Pearson Grippe MD 08/25/2019, 9:39 AM  Recent Labs  Lab 08/23/19 0430  08/24/19 0432  08/24/19 2000 08/25/19 0222 08/25/19 0809  NA 121*  120*   < > 135   < > 135 135 135  K 3.9  --  3.3*  --   --  4.2  --   CL 90*  --  103  --   --  104  --   CO2 24  --  23  --   --  26  --   GLUCOSE 94  --  130*  --   --  128*  --   BUN 8  --  <5*  --   --  <5*  --   CREATININE 0.40*  --  0.58  --   --  0.54  --   CALCIUM 7.5*  --  8.1*  --   --  8.4*  --    < > = values in this interval not displayed.   Recent Labs  Lab 08/23/19 0430 08/24/19 0432 08/25/19 0222  WBC 7.2 8.6 8.2  NEUTROABS 5.2 6.2 5.3  HGB 12.1 12.7 12.3  HCT 33.7* 37.1 36.5  MCV 90.6 95.4 96.6  PLT 251 277 278

## 2019-08-31 ENCOUNTER — Emergency Department (HOSPITAL_COMMUNITY): Payer: Medicaid Other

## 2019-08-31 ENCOUNTER — Inpatient Hospital Stay (HOSPITAL_COMMUNITY)
Admission: EM | Admit: 2019-08-31 | Discharge: 2019-09-03 | DRG: 194 | Disposition: A | Discharge status: Home / Self Care | Payer: Medicaid Other | Attending: Family Medicine | Admitting: Family Medicine

## 2019-08-31 ENCOUNTER — Encounter (HOSPITAL_COMMUNITY): Payer: Self-pay | Admitting: Emergency Medicine

## 2019-08-31 ENCOUNTER — Other Ambulatory Visit: Payer: Self-pay

## 2019-08-31 DIAGNOSIS — J9811 Atelectasis: Secondary | ICD-10-CM | POA: Diagnosis present

## 2019-08-31 DIAGNOSIS — Z888 Allergy status to other drugs, medicaments and biological substances status: Secondary | ICD-10-CM

## 2019-08-31 DIAGNOSIS — E222 Syndrome of inappropriate secretion of antidiuretic hormone: Secondary | ICD-10-CM | POA: Diagnosis present

## 2019-08-31 DIAGNOSIS — J189 Pneumonia, unspecified organism: Principal | ICD-10-CM | POA: Diagnosis present

## 2019-08-31 DIAGNOSIS — R0902 Hypoxemia: Secondary | ICD-10-CM

## 2019-08-31 DIAGNOSIS — R410 Disorientation, unspecified: Secondary | ICD-10-CM

## 2019-08-31 DIAGNOSIS — F17200 Nicotine dependence, unspecified, uncomplicated: Secondary | ICD-10-CM | POA: Diagnosis present

## 2019-08-31 DIAGNOSIS — R059 Cough, unspecified: Secondary | ICD-10-CM

## 2019-08-31 DIAGNOSIS — F329 Major depressive disorder, single episode, unspecified: Secondary | ICD-10-CM | POA: Diagnosis present

## 2019-08-31 DIAGNOSIS — R05 Cough: Secondary | ICD-10-CM

## 2019-08-31 DIAGNOSIS — J440 Chronic obstructive pulmonary disease with acute lower respiratory infection: Secondary | ICD-10-CM | POA: Diagnosis present

## 2019-08-31 DIAGNOSIS — I119 Hypertensive heart disease without heart failure: Secondary | ICD-10-CM | POA: Diagnosis present

## 2019-08-31 DIAGNOSIS — F309 Manic episode, unspecified: Secondary | ICD-10-CM | POA: Diagnosis present

## 2019-08-31 DIAGNOSIS — Z79899 Other long term (current) drug therapy: Secondary | ICD-10-CM

## 2019-08-31 DIAGNOSIS — Z20828 Contact with and (suspected) exposure to other viral communicable diseases: Secondary | ICD-10-CM | POA: Diagnosis present

## 2019-08-31 LAB — CBC WITH DIFFERENTIAL/PLATELET
Abs Immature Granulocytes: 0.03 10*3/uL (ref 0.00–0.07)
Basophils Absolute: 0.1 10*3/uL (ref 0.0–0.1)
Basophils Relative: 1 %
Eosinophils Absolute: 0.1 10*3/uL (ref 0.0–0.5)
Eosinophils Relative: 1 %
HCT: 40.3 % (ref 36.0–46.0)
Hemoglobin: 13.4 g/dL (ref 12.0–15.0)
Immature Granulocytes: 0 %
Lymphocytes Relative: 16 %
Lymphs Abs: 1.5 10*3/uL (ref 0.7–4.0)
MCH: 32.8 pg (ref 26.0–34.0)
MCHC: 33.3 g/dL (ref 30.0–36.0)
MCV: 98.8 fL (ref 80.0–100.0)
Monocytes Absolute: 0.4 10*3/uL (ref 0.1–1.0)
Monocytes Relative: 4 %
Neutro Abs: 7.5 10*3/uL (ref 1.7–7.7)
Neutrophils Relative %: 78 %
Platelets: 280 10*3/uL (ref 150–400)
RBC: 4.08 MIL/uL (ref 3.87–5.11)
RDW: 14.3 % (ref 11.5–15.5)
WBC: 9.5 10*3/uL (ref 4.0–10.5)
nRBC: 0 % (ref 0.0–0.2)

## 2019-08-31 LAB — COMPREHENSIVE METABOLIC PANEL
ALT: 11 U/L (ref 0–44)
AST: 7 U/L — ABNORMAL LOW (ref 15–41)
Albumin: 3.7 g/dL (ref 3.5–5.0)
Alkaline Phosphatase: 80 U/L (ref 38–126)
Anion gap: 8 (ref 5–15)
BUN: 8 mg/dL (ref 6–20)
CO2: 23 mmol/L (ref 22–32)
Calcium: 8.3 mg/dL — ABNORMAL LOW (ref 8.9–10.3)
Chloride: 106 mmol/L (ref 98–111)
Creatinine, Ser: 0.73 mg/dL (ref 0.44–1.00)
GFR calc Af Amer: >60 mL/min (ref >60)
GFR calc non Af Amer: >60 mL/min (ref >60)
Glucose, Bld: 97 mg/dL (ref 70–99)
Potassium: 3.6 mmol/L (ref 3.5–5.1)
Sodium: 137 mmol/L (ref 135–145)
Total Bilirubin: 0.3 mg/dL (ref 0.3–1.2)
Total Protein: 6.8 g/dL (ref 6.5–8.1)

## 2019-08-31 LAB — URINALYSIS, ROUTINE W REFLEX MICROSCOPIC
Bilirubin Urine: NEGATIVE
Glucose, UA: NEGATIVE mg/dL
Hgb urine dipstick: NEGATIVE
Ketones, ur: NEGATIVE mg/dL
Leukocytes,Ua: NEGATIVE
Nitrite: NEGATIVE
Protein, ur: NEGATIVE mg/dL
Specific Gravity, Urine: 1.002 — ABNORMAL LOW (ref 1.005–1.030)
pH: 7 (ref 5.0–8.0)

## 2019-08-31 LAB — RAPID URINE DRUG SCREEN, HOSP PERFORMED
Amphetamines: NOT DETECTED
Barbiturates: NOT DETECTED
Benzodiazepines: NOT DETECTED
Cocaine: NOT DETECTED
Opiates: NOT DETECTED
Tetrahydrocannabinol: NOT DETECTED

## 2019-08-31 LAB — ETHANOL: Alcohol, Ethyl (B): <10 mg/dL (ref <10)

## 2019-08-31 MED ORDER — ALBUTEROL SULFATE HFA 108 (90 BASE) MCG/ACT IN AERS
2.0000 | INHALATION_SPRAY | RESPIRATORY_TRACT | Status: DC
  Administered 2019-09-01: 2 via RESPIRATORY_TRACT
  Filled 2019-08-31: qty 6.7

## 2019-08-31 MED ORDER — ESCITALOPRAM OXALATE 10 MG PO TABS
10.0000 mg | ORAL_TABLET | Freq: Every day | ORAL | Status: DC
  Administered 2019-09-02: 10 mg via ORAL
  Filled 2019-08-31: qty 1

## 2019-08-31 MED ORDER — STERILE WATER FOR INJECTION IJ SOLN
INTRAMUSCULAR | Status: AC
  Administered 2019-08-31: 21:00:00
  Filled 2019-08-31: qty 10

## 2019-08-31 MED ORDER — UMECLIDINIUM BROMIDE 62.5 MCG/INH IN AEPB
1.0000 | INHALATION_SPRAY | Freq: Every day | RESPIRATORY_TRACT | Status: DC
  Administered 2019-09-01 – 2019-09-03 (×3): 1 via RESPIRATORY_TRACT
  Filled 2019-08-31: qty 7

## 2019-08-31 MED ORDER — FLUTICASONE FUROATE-VILANTEROL 100-25 MCG/INH IN AEPB
1.0000 | INHALATION_SPRAY | Freq: Every day | RESPIRATORY_TRACT | Status: DC
  Administered 2019-09-01 – 2019-09-03 (×3): 1 via RESPIRATORY_TRACT
  Filled 2019-08-31 (×2): qty 28

## 2019-08-31 MED ORDER — ZIPRASIDONE MESYLATE 20 MG IM SOLR
10.0000 mg | Freq: Once | INTRAMUSCULAR | Status: AC
  Administered 2019-08-31: 10 mg via INTRAMUSCULAR
  Filled 2019-08-31: qty 20

## 2019-08-31 MED ORDER — FLUTICASONE-UMECLIDIN-VILANT 100-62.5-25 MCG/INH IN AEPB: 1.0000 | INHALATION_SPRAY | Freq: Every day | RESPIRATORY_TRACT | Status: DC

## 2019-08-31 MED ORDER — SODIUM CHLORIDE 0.9 % IV BOLUS
500.0000 mL | Freq: Once | INTRAVENOUS | Status: AC
  Administered 2019-08-31: 500 mL via INTRAVENOUS

## 2019-08-31 NOTE — BH Assessment (Addendum)
Tele Assessment Note   Patient Name: Rebecca Liu MRN: [id number] Referring Physician: Dr. Dorthula Perfect Location of Patient: [facility] Location of Provider: Behavioral Health TTS Department  Rebecca Liu is an 53 y.o. female presenting under IVC for mania. When asked why are you here, patient stated "because I had loss of memory and I was talking to myself". Per triage note, patient boyfriend reported patient was admitted here recently for low sodium and potassium. Boyfriend stated "she has lost her memory since she left here and we took her to her doctor today and he said to get to the hospital right away". Patient was confused, hollering and aggressive in triage. Patient denied receiving any mental health outpatient services at this time. Patient denied prior suicide attempts and self-harming behaviors. Patient currently resides alone and has a boyfriend Rebecca Liu)  that sometimes lives with her, whom is her main support. Patient reported normal sleep and appetite. Patient denied SI, HI, psychosis and alcohol/drug usage.   PER IVC: Patient manic haven't slept in 3 day and is manic with flight of ideas.  Diagnosis: Psychosis disorder   Past Medical History:  Past Medical History:  Diagnosis Date  . COPD (chronic obstructive pulmonary disease) (HCC)   . Depression     History reviewed. No pertinent surgical history.  Family History: History reviewed. No pertinent family history.  Social History:  reports that she has been smoking. She has never used smokeless tobacco. She reports that she does not drink alcohol or use drugs.  Additional Social History:  Alcohol / Drug Use Pain Medications: see MAR Prescriptions: see MAR Over the Counter: see MAR  CIWA: CIWA-Ar BP: (!) 178/121 Pulse Rate: (!) 123 COWS:    Allergies:  Allergies  Allergen Reactions  . Wellbutrin [Bupropion] Other (See Comments)    Reaction is not known per spouse    Home Medications: (Not in a  hospital admission)   OB/GYN Status:  No LMP recorded. Patient is postmenopausal.  General Assessment Data Location of Assessment: [facility] TTS Assessment: In system Is this a Tele or Face-to-Face Assessment?: Tele Assessment Is this an Initial Assessment or a Re-assessment for this encounter?: Initial Assessment Patient Accompanied by:: N/A Language Other than English: No Living Arrangements: (lives alone) What gender do you identify as?: Female Marital status: Single Living Arrangements: Alone Can pt return to current living arrangement?: Yes Admission Status: Voluntary Is patient capable of signing voluntary admission?: Yes Referral Source: Self/Family/Friend     Crisis Care Plan Living Arrangements: Alone Legal Guardian: (self) Name of Psychiatrist: (none reported) Name of Therapist: (none reported)  Education Status Is patient currently in school?: No Is the patient employed, unemployed or receiving disability?: Receiving disability income  Risk to self with the past 6 months Suicidal Ideation: No Has patient been a risk to self within the past 6 months prior to admission? : No Suicidal Intent: No Has patient had any suicidal intent within the past 6 months prior to admission? : No Is patient at risk for suicide?: No Suicidal Plan?: No Has patient had any suicidal plan within the past 6 months prior to admission? : No Access to Means: No What has been your use of drugs/alcohol within the last 12 months?: (none reported) Previous Attempts/Gestures: No How many times?: (0) Other Self Harm Risks: (none reported) Triggers for Past Attempts: (n/a) Intentional Self Injurious Behavior: None Family Suicide History: No Recent stressful life event(s): (memory loss ) Persecutory voices/beliefs?: No Depression: No Depression Symptoms: (denied) Substance abuse  history and/or treatment for substance abuse?: No Suicide prevention information given to non-admitted patients:  Not applicable  Risk to Others within the past 6 months Homicidal Ideation: No Does patient have any lifetime risk of violence toward others beyond the six months prior to admission? : No Thoughts of Harm to Others: No Current Homicidal Intent: No Current Homicidal Plan: No Access to Homicidal Means: No Identified Victim: (n/a) History of harm to others?: No Assessment of Violence: None Noted Violent Behavior Description: (none reported) Does patient have access to weapons?: No Criminal Charges Pending?: No Does patient have a court date: No Is patient on probation?: No  Psychosis Hallucinations: Auditory Delusions: None noted  Mental Status Report Appearance/Hygiene: Unremarkable Eye Contact: Fair Motor Activity: Freedom of movement Speech: Slow, Soft Level of Consciousness: Alert Mood: Sad Affect: Sad Anxiety Level: Moderate Thought Processes: Coherent, Relevant Judgement: Impaired Orientation: Person, Place, Time, Situation Obsessive Compulsive Thoughts/Behaviors: None  Cognitive Functioning Concentration: Fair Memory: Recent Intact Is patient IDD: No Insight: Fair Impulse Control: Poor Appetite: Good Have you had any weight changes? : No Change Sleep: No Change Total Hours of Sleep: (7-8) Vegetative Symptoms: None  ADLScreening [facility] Assessment Services) Patient's cognitive ability adequate to safely complete daily activities?: Yes Patient able to express need for assistance with ADLs?: Yes Independently performs ADLs?: Yes (appropriate for developmental age)  Prior Inpatient Therapy Prior Inpatient Therapy: No  Prior Outpatient Therapy Prior Outpatient Therapy: No Does patient have an ACCT team?: No Does patient have Intensive In-House Services?  : No Does patient have [facility] services? : No Does patient have P4CC services?: No  ADL Screening (condition at time of admission) Patient's cognitive ability adequate to safely complete daily activities?:  Yes Patient able to express need for assistance with ADLs?: Yes Independently performs ADLs?: Yes (appropriate for developmental age)   Regulatory affairs officer (For Healthcare) Does Patient Have a Medical Advance Directive?: No   Disposition:  Disposition Initial Assessment Completed for this Encounter: Yes   Renetta Chalk, NP, recommends overnight observation for safety and stabilization with psych reassessment in the AM.   This service was provided via telemedicine using a 2-way, interactive audio and video technology.  Names of all persons participating in this telemedicine service and their role in this encounter. Name: Rebecca Liu Role: Patient  Name: Kirtland Bouchard Role: TTS Clinician  Name:  Role:   Name: Role:     Venora Maples 08/31/2019 10:16 PM

## 2019-08-31 NOTE — ED Notes (Signed)
Pt belongings placed in locker.  Belongings as follows: Therapist, art Shoes

## 2019-08-31 NOTE — ED Notes (Signed)
Pt receiving tts consult at this time.  

## 2019-08-31 NOTE — ED Provider Notes (Signed)
[facility] Provider Note   CSN: [id number] Arrival date & time: 08/31/19  1558     History   Chief Complaint Chief Complaint  Patient presents with  . Altered Mental Status    HPI Rebecca BlancoJeanie Sundquist is a 53 y.o. female.     According to the patient's boyfriend she has been awake for 3 straight days confused combative.  She was admitted for hyponatremia recently but when she left the hospital she was back to her normal self  The history is provided by the patient and a relative. No language interpreter was used.  Altered Mental Status Presenting symptoms: behavior changes   Severity:  Severe Most recent episode:  More than 2 days ago Episode history:  Continuous Timing:  Constant Progression:  Worsening Chronicity:  New Context: not alcohol use   Associated symptoms: no abdominal pain     Past Medical History:  Diagnosis Date  . COPD (chronic obstructive pulmonary disease) (HCC)   . Depression     Patient Active Problem List   Diagnosis Date Noted  . Hyponatremia 08/21/2019  . Essential hypertension 08/21/2019  . High risk medication use 08/21/2019  . Chronic headache disorder 08/21/2019  . Hypomagnesemia 08/21/2019  . Depression   . COPD (chronic obstructive pulmonary disease) (HCC)   . Hypokalemia   . Nausea and vomiting   . Current smoker   . Leukocytosis   . GAD (generalized anxiety disorder)     History reviewed. No pertinent surgical history.   OB History   No obstetric history on file.      Home Medications    Prior to Admission medications   Medication Sig Start Date End Date Taking? Authorizing Provider  albuterol (VENTOLIN HFA) 108 (90 Base) MCG/ACT inhaler Inhale 2 puffs into the lungs every 4 (four) hours. 06/14/19  Yes [provider]  ALPRAZolam Prudy Feeler(XANAX) 0.5 MG tablet Take 0.125-0.5 tablets by mouth daily.  08/18/19  Yes [provider]  escitalopram (LEXAPRO) 10 MG tablet Take 1 tablet (10 mg  total) by mouth daily. 08/25/19  Yes Tat, Onalee Huaavid, MD  Fluticasone-Umeclidin-Vilant (TRELEGY ELLIPTA) 100-62.5-25 MCG/INH AEPB Take 1 puff by mouth daily. 07/13/19  Yes [provider]    Family History History reviewed. No pertinent family history.  Social History Social History   Tobacco Use  . Smoking status: Current Every Day Smoker  . Smokeless tobacco: Never Used  Substance Use Topics  . Alcohol use: No  . Drug use: No     Allergies   Wellbutrin [bupropion]   Review of Systems Review of Systems  Unable to perform ROS: Mental status change  Gastrointestinal: Negative for abdominal pain.     Physical Exam Updated Vital Signs BP (!) 178/121   Pulse (!) 123   Temp 98.6 F (37 C) (Oral)   Resp 20   SpO2 94%   Physical Exam Vitals signs reviewed.  Constitutional:      Appearance: She is well-developed.  HENT:     Head: Normocephalic.     Nose: Nose normal.  Eyes:     General: No scleral icterus.    Conjunctiva/sclera: Conjunctivae normal.  Neck:     Musculoskeletal: Neck supple.     Thyroid: No thyromegaly.  Cardiovascular:     Rate and Rhythm: Normal rate and regular rhythm.     Heart sounds: No murmur. No friction rub. No gallop.   Pulmonary:     Breath sounds: No stridor. No wheezing or rales.  Chest:  Chest wall: No tenderness.  Abdominal:     General: There is no distension.     Tenderness: There is no abdominal tenderness. There is no rebound.  Musculoskeletal: Normal range of motion.  Lymphadenopathy:     Cervical: No cervical adenopathy.  Skin:    Findings: No erythema or rash.  Neurological:     Mental Status: She is alert.     Motor: No abnormal muscle tone.     Coordination: Coordination normal.     Comments: Patient oriented to person and place  Psychiatric:     Comments: Patient having flight of ideas.  She is combative she does know who she is and where she is.  She cannot hold a conversation because she is talking about  something different things at once.  Patient has not slept in 3 days and is having a manic episode      ED Treatments / Results  Labs (all labs ordered are listed, but only abnormal results are displayed) Labs Reviewed  COMPREHENSIVE METABOLIC PANEL - Abnormal; Notable for the following components:      Result Value   Calcium 8.3 (*)    AST 7 (*)    All other components within normal limits  URINALYSIS, ROUTINE W REFLEX MICROSCOPIC - Abnormal; Notable for the following components:   Color, Urine COLORLESS (*)    Specific Gravity, Urine 1.002 (*)    All other components within normal limits  CBC WITH DIFFERENTIAL/PLATELET  RAPID URINE DRUG SCREEN, HOSP PERFORMED  ETHANOL  POC URINE PREG, ED    EKG None  Radiology Ct Head Wo Contrast  Result Date: 08/31/2019 CLINICAL DATA:  Altered level of consciousness. EXAM: CT HEAD WITHOUT CONTRAST TECHNIQUE: Contiguous axial images were obtained from the base of the skull through the vertex without intravenous contrast. COMPARISON:  CT head without contrast 08/21/2019 FINDINGS: Brain: No acute infarct, hemorrhage, or mass lesion is present. No significant white matter lesions are present. The ventricles are of normal size. No significant extraaxial fluid collection is present. Vascular: No hyperdense vessel or unexpected calcification. Skull: Calvarium is intact. No focal lytic or blastic lesions are present. Sinuses/Orbits: The paranasal sinuses and mastoid air cells are clear. The globes and orbits are within normal limits. IMPRESSION: Normal CT of the head. Electronically Signed   By: San Morelle M.D.   On: 08/31/2019 18:51    Procedures Procedures (including critical care time)  Medications Ordered in ED Medications  albuterol (VENTOLIN HFA) 108 (90 Base) MCG/ACT inhaler 2 puff (has no administration in time range)  escitalopram (LEXAPRO) tablet 10 mg (has no administration in time range)  Fluticasone-Umeclidin-Vilant  100-62.5-25 MCG/INH AEPB 1 puff (has no administration in time range)  sodium chloride 0.9 % bolus 500 mL (0 mLs Intravenous Stopped 08/31/19 1954)  ziprasidone (GEODON) injection 10 mg (10 mg Intramuscular Given 08/31/19 2107)  sterile water (preservative free) injection (  Given 08/31/19 2122)     Initial Impression / Assessment and Plan / ED Course  I have reviewed the triage vital signs and the nursing notes.  Pertinent labs & imaging results that were available during my care of the patient were reviewed by me and considered in my medical decision making (see chart for details).        Patient with manic episode.  Labs and x-rays unremarkable.  Behavioral health will evaluate  Final Clinical Impressions(s) / ED Diagnoses   Final diagnoses:  None    ED Discharge Orders    None  Bethann Berkshire, MD 08/31/19 [phone]

## 2019-08-31 NOTE — ED Notes (Signed)
IVC paper work faxed to [facility] and [facility]

## 2019-08-31 NOTE — ED Notes (Addendum)
Pt continually talking. Jumping from subject to subject. Unable to stay focused Stating "I am in control " repeatiously. Very restless in bed. Wanting to go and smoke and see sister. Husband at bedside

## 2019-08-31 NOTE — ED Triage Notes (Signed)
Patient's spouse states patient was admitted here recently for low sodium and potassium. States "she has lost her memory since she left here and we took her to her doctor today and he said to get to the hospital right away." Patient confused, hollering, and aggressive in triage.

## 2019-09-01 ENCOUNTER — Inpatient Hospital Stay (HOSPITAL_COMMUNITY): Payer: Medicaid Other

## 2019-09-01 DIAGNOSIS — R410 Disorientation, unspecified: Secondary | ICD-10-CM

## 2019-09-01 DIAGNOSIS — J440 Chronic obstructive pulmonary disease with acute lower respiratory infection: Secondary | ICD-10-CM | POA: Diagnosis present

## 2019-09-01 DIAGNOSIS — I119 Hypertensive heart disease without heart failure: Secondary | ICD-10-CM | POA: Diagnosis present

## 2019-09-01 DIAGNOSIS — F309 Manic episode, unspecified: Secondary | ICD-10-CM | POA: Diagnosis present

## 2019-09-01 DIAGNOSIS — J189 Pneumonia, unspecified organism: Secondary | ICD-10-CM | POA: Diagnosis not present

## 2019-09-01 DIAGNOSIS — J9811 Atelectasis: Secondary | ICD-10-CM | POA: Diagnosis present

## 2019-09-01 DIAGNOSIS — Z888 Allergy status to other drugs, medicaments and biological substances status: Secondary | ICD-10-CM | POA: Diagnosis not present

## 2019-09-01 DIAGNOSIS — Z20828 Contact with and (suspected) exposure to other viral communicable diseases: Secondary | ICD-10-CM | POA: Diagnosis present

## 2019-09-01 DIAGNOSIS — R05 Cough: Secondary | ICD-10-CM | POA: Diagnosis not present

## 2019-09-01 DIAGNOSIS — F329 Major depressive disorder, single episode, unspecified: Secondary | ICD-10-CM | POA: Diagnosis present

## 2019-09-01 DIAGNOSIS — E222 Syndrome of inappropriate secretion of antidiuretic hormone: Secondary | ICD-10-CM | POA: Diagnosis present

## 2019-09-01 DIAGNOSIS — F17200 Nicotine dependence, unspecified, uncomplicated: Secondary | ICD-10-CM | POA: Diagnosis present

## 2019-09-01 DIAGNOSIS — Z79899 Other long term (current) drug therapy: Secondary | ICD-10-CM | POA: Diagnosis not present

## 2019-09-01 LAB — BASIC METABOLIC PANEL
Anion gap: 9 (ref 5–15)
BUN: 8 mg/dL (ref 6–20)
CO2: 26 mmol/L (ref 22–32)
Calcium: 8.7 mg/dL — ABNORMAL LOW (ref 8.9–10.3)
Chloride: 110 mmol/L (ref 98–111)
Creatinine, Ser: 0.64 mg/dL (ref 0.44–1.00)
GFR calc Af Amer: >60 mL/min (ref >60)
GFR calc non Af Amer: >60 mL/min (ref >60)
Glucose, Bld: 94 mg/dL (ref 70–99)
Potassium: 3.8 mmol/L (ref 3.5–5.1)
Sodium: 145 mmol/L (ref 135–145)

## 2019-09-01 LAB — PREGNANCY, URINE: Preg Test, Ur: NEGATIVE

## 2019-09-01 LAB — BRAIN NATRIURETIC PEPTIDE: B Natriuretic Peptide: 31 pg/mL (ref 0.0–100.0)

## 2019-09-01 LAB — SARS CORONAVIRUS 2 BY RT PCR (HOSPITAL ORDER, PERFORMED IN ~~LOC~~ HOSPITAL LAB): SARS Coronavirus 2: NEGATIVE

## 2019-09-01 MED ORDER — ACETAMINOPHEN 325 MG PO TABS
650.0000 mg | ORAL_TABLET | Freq: Four times a day (QID) | ORAL | Status: DC | PRN
  Administered 2019-09-01: 650 mg via ORAL
  Filled 2019-09-01: qty 2

## 2019-09-01 MED ORDER — ENOXAPARIN SODIUM 40 MG/0.4ML ~~LOC~~ SOLN
40.0000 mg | SUBCUTANEOUS | Status: DC
  Administered 2019-09-02 – 2019-09-03 (×2): 40 mg via SUBCUTANEOUS
  Filled 2019-09-01 (×3): qty 0.4

## 2019-09-01 MED ORDER — QUETIAPINE FUMARATE 25 MG PO TABS: 50.0000 mg | ORAL_TABLET | Freq: Every day | ORAL | Status: DC

## 2019-09-01 MED ORDER — SODIUM CHLORIDE 0.9 % IV SOLN
500.0000 mg | Freq: Once | INTRAVENOUS | Status: AC
  Administered 2019-09-01: 500 mg via INTRAVENOUS
  Filled 2019-09-01: qty 500

## 2019-09-01 MED ORDER — SODIUM CHLORIDE 0.9 % IV SOLN
1.0000 g | INTRAVENOUS | Status: DC
  Administered 2019-09-02: 1 g via INTRAVENOUS
  Filled 2019-09-01: qty 10

## 2019-09-01 MED ORDER — ONDANSETRON HCL 4 MG PO TABS: 4.0000 mg | ORAL_TABLET | Freq: Four times a day (QID) | ORAL | Status: DC | PRN

## 2019-09-01 MED ORDER — SODIUM CHLORIDE 0.9 % IV SOLN
1.0000 g | Freq: Once | INTRAVENOUS | Status: AC
  Administered 2019-09-01: 1 g via INTRAVENOUS
  Filled 2019-09-01: qty 10

## 2019-09-01 MED ORDER — LABETALOL HCL 5 MG/ML IV SOLN
10.0000 mg | INTRAVENOUS | Status: DC | PRN
  Administered 2019-09-01: 10 mg via INTRAVENOUS
  Filled 2019-09-01: qty 4

## 2019-09-01 MED ORDER — QUETIAPINE FUMARATE 25 MG PO TABS
25.0000 mg | ORAL_TABLET | Freq: Two times a day (BID) | ORAL | Status: DC
  Administered 2019-09-01 – 2019-09-02 (×2): 25 mg via ORAL
  Filled 2019-09-01 (×2): qty 1

## 2019-09-01 MED ORDER — ONDANSETRON HCL 4 MG/2ML IJ SOLN: 4.0000 mg | Freq: Four times a day (QID) | INTRAMUSCULAR | Status: DC | PRN

## 2019-09-01 MED ORDER — SODIUM CHLORIDE 0.9 % IV SOLN
500.0000 mg | INTRAVENOUS | Status: DC
  Administered 2019-09-02: 500 mg via INTRAVENOUS
  Filled 2019-09-01: qty 500

## 2019-09-01 MED ORDER — SODIUM CHLORIDE 0.9 % IV SOLN
INTRAVENOUS | Status: DC
  Administered 2019-09-02: 05:00:00 via INTRAVENOUS

## 2019-09-01 MED ORDER — ACETAMINOPHEN 650 MG RE SUPP: 650.0000 mg | Freq: Four times a day (QID) | RECTAL | Status: DC | PRN

## 2019-09-01 MED ORDER — HALOPERIDOL LACTATE 5 MG/ML IJ SOLN
5.0000 mg | Freq: Four times a day (QID) | INTRAMUSCULAR | Status: DC | PRN
  Administered 2019-09-01 (×2): 5 mg via INTRAVENOUS
  Filled 2019-09-01 (×2): qty 1

## 2019-09-01 NOTE — H&P (Signed)
TRH H&P    Patient Demographics:    Rebecca Liu, is a 53 y.o. female  MRN: [id number]  DOB - 05-Nov-1966  Admit Date - 08/31/2019  Referring MD/NP/PA: Dr. Bebe Shaggy  Outpatient Primary MD for the patient is Ardell Isaacs, NP  Patient coming from: Home  Chief complaint-altered mental status   HPI:    Rebecca Liu  is a 53 y.o. female, with history of COPD, hypertension, depression who was recently discharged from the hospital after treatment for hyponatremia, was brought to ED by patient by friend for altered mental status.  Patient has been awake for 3 days 3 days and has been confused and combative. In the ED chest x-ray showed community-acquired pneumonia and patient started on ceftriaxone and Zithromax for pneumonia.  Once medically stable she will require psychiatric evaluation. Patient is somnolent but arousable, answering questions. Though history is limited. She denies chest pain or shortness of breath. Denies abdominal pain. Denies dysuria.     Review of systems:    In addition to the HPI above,   All other systems reviewed and are negative.    Past History of the following :    Past Medical History:  Diagnosis Date   COPD (chronic obstructive pulmonary disease) (HCC)    Depression       History reviewed. No pertinent surgical history.    Social History:      Social History   Tobacco Use   Smoking status: Current Every Day Smoker   Smokeless tobacco: Never Used  Substance Use Topics   Alcohol use: No       Family History :   Unable to obtain    Home Medications:   Prior to Admission medications   Medication Sig Start Date End Date Taking? Authorizing Provider  albuterol (VENTOLIN HFA) 108 (90 Base) MCG/ACT inhaler Inhale 2 puffs into the lungs every 4 (four) hours. 06/14/19  Yes [provider]  ALPRAZolam Prudy Feeler) 0.5 MG tablet Take 0.125-0.5  tablets by mouth daily.  08/18/19  Yes [provider]  escitalopram (LEXAPRO) 10 MG tablet Take 1 tablet (10 mg total) by mouth daily. 08/25/19  Yes Tat, Onalee Hua, MD  Fluticasone-Umeclidin-Vilant (TRELEGY ELLIPTA) 100-62.5-25 MCG/INH AEPB Take 1 puff by mouth daily. 07/13/19  Yes [provider]     Allergies:     Allergies  Allergen Reactions   Wellbutrin [Bupropion] Other (See Comments)    Reaction is not known per spouse     Physical Exam:   Vitals  Blood pressure (!) 144/88, pulse 95, temperature 99 F (37.2 C), temperature source Oral, resp. rate 15, SpO2 97 %.  1.  General: Appears in no acute distress  2. Psychiatric: Somnolent but arousable, oriented x2  3. Neurologic: Cranial nerves II through XII grossly intact, motor strength 5/5 in all extremities  4. HEENMT:  Atraumatic normocephalic, extraocular muscles are intact  5. Respiratory : Clear to auscultation bilaterally  6. Cardiovascular : S1-S2, regular  7. Gastrointestinal:  Abdominal soft, nontender, no organomegaly  8. Skin:  No rashes noted  Data Review:    CBC Recent Labs  Lab 08/31/19 1755  WBC 9.5  HGB 13.4  HCT 40.3  PLT 280  MCV 98.8  MCH 32.8  MCHC 33.3  RDW 14.3  LYMPHSABS 1.5  MONOABS 0.4  EOSABS 0.1  BASOSABS 0.1   ------------------------------------------------------------------------------------------------------------------  Results for orders placed or performed during the hospital encounter of 08/31/19 (from the past 48 hour(s))  Urinalysis, Routine w reflex microscopic     Status: Abnormal   Collection Time: 08/31/19  5:37 PM  Result Value Ref Range   Color, Urine COLORLESS (A) YELLOW   APPearance CLEAR CLEAR   Specific Gravity, Urine 1.002 (L) 1.005 - 1.030   pH 7.0 5.0 - 8.0   Glucose, UA NEGATIVE NEGATIVE mg/dL   Hgb urine dipstick NEGATIVE NEGATIVE   Bilirubin Urine NEGATIVE NEGATIVE   Ketones, ur NEGATIVE NEGATIVE mg/dL   Protein,  ur NEGATIVE NEGATIVE mg/dL   Nitrite NEGATIVE NEGATIVE   Leukocytes,Ua NEGATIVE NEGATIVE    Comment: Performed at [facility], [address] [place], [place] [address]  CBC with Differential/Platelet     Status: None   Collection Time: 08/31/19  5:55 PM  Result Value Ref Range   WBC 9.5 4.0 - 10.5 K/uL   RBC 4.08 3.87 - 5.11 MIL/uL   Hemoglobin 13.4 12.0 - 15.0 g/dL   HCT 32.940.3 51.836.0 - 84.146.0 %   MCV 98.8 80.0 - 100.0 fL   MCH 32.8 26.0 - 34.0 pg   MCHC 33.3 30.0 - 36.0 g/dL   RDW 66.014.3 63.011.5 - 16.015.5 %   Platelets 280 150 - 400 K/uL   nRBC 0.0 0.0 - 0.2 %   Neutrophils Relative % 78 %   Neutro Abs 7.5 1.7 - 7.7 K/uL   Lymphocytes Relative 16 %   Lymphs Abs 1.5 0.7 - 4.0 K/uL   Monocytes Relative 4 %   Monocytes Absolute 0.4 0.1 - 1.0 K/uL   Eosinophils Relative 1 %   Eosinophils Absolute 0.1 0.0 - 0.5 K/uL   Basophils Relative 1 %   Basophils Absolute 0.1 0.0 - 0.1 K/uL   Immature Granulocytes 0 %   Abs Immature Granulocytes 0.03 0.00 - 0.07 K/uL    Comment: Performed at [facility], [address] [place], [place] [address]  Comprehensive metabolic panel     Status: Abnormal   Collection Time: 08/31/19  5:55 PM  Result Value Ref Range   Sodium 137 135 - 145 mmol/L   Potassium 3.6 3.5 - 5.1 mmol/L   Chloride 106 98 - 111 mmol/L   CO2 23 22 - 32 mmol/L   Glucose, Bld 97 70 - 99 mg/dL   BUN 8 6 - 20 mg/dL   Creatinine, Ser 3.550.73 0.44 - 1.00 mg/dL   Calcium 8.3 (L) 8.9 - 10.3 mg/dL   Total Protein 6.8 6.5 - 8.1 g/dL   Albumin 3.7 3.5 - 5.0 g/dL   AST 7 (L) 15 - 41 U/L   ALT 11 0 - 44 U/L   Alkaline Phosphatase 80 38 - 126 U/L   Total Bilirubin 0.3 0.3 - 1.2 mg/dL   GFR calc non Af Amer >60 >60 mL/min   GFR calc Af Amer >60 >60 mL/min   Anion gap 8 5 - 15    Comment: Performed at [facility], [address] [place], [place] [address]  Ethanol     Status: None   Collection Time: 08/31/19  5:55 PM  Result Value Ref Range  Alcohol, Ethyl (B) <10 <10 mg/dL    Comment:  (NOTE) Lowest detectable limit for serum alcohol is 10 mg/dL. For medical purposes only. Performed at [facility], [address], [place], [place] [address]   Brain natriuretic peptide     Status: None   Collection Time: 08/31/19  5:56 PM  Result Value Ref Range   B Natriuretic Peptide 31.0 0.0 - 100.0 pg/mL    Comment: Performed at [facility], [address] [place], [place] [address]  Rapid urine drug screen (hospital performed)     Status: None   Collection Time: 08/31/19  6:24 PM  Result Value Ref Range   Opiates NONE DETECTED NONE DETECTED   Cocaine NONE DETECTED NONE DETECTED   Benzodiazepines NONE DETECTED NONE DETECTED   Amphetamines NONE DETECTED NONE DETECTED   Tetrahydrocannabinol NONE DETECTED NONE DETECTED   Barbiturates NONE DETECTED NONE DETECTED    Comment: (NOTE) DRUG SCREEN FOR MEDICAL PURPOSES ONLY.  IF CONFIRMATION IS NEEDED FOR ANY PURPOSE, NOTIFY LAB WITHIN 5 DAYS. LOWEST DETECTABLE LIMITS FOR URINE DRUG SCREEN Drug Class                     Cutoff (ng/mL) Amphetamine and metabolites    1000 Barbiturate and metabolites    200 Benzodiazepine                 892 Tricyclics and metabolites     300 Opiates and metabolites        300 Cocaine and metabolites        300 THC                            50 Performed at [facility], [address], [place], [place] [address]   Pregnancy, urine     Status: None   Collection Time: 08/31/19 11:15 PM  Result Value Ref Range   Preg Test, Ur NEGATIVE NEGATIVE    Comment:        THE SENSITIVITY OF THIS METHODOLOGY IS >20 mIU/mL. Performed at [facility], [address], [place], [place] [address]   SARS Coronavirus 2 by RT PCR (hospital order, performed in [facility] hospital lab) Nasopharyngeal Nasopharyngeal Swab     Status: None   Collection Time: 09/01/19 12:05 AM   Specimen: Nasopharyngeal Swab  Result Value Ref Range   SARS Coronavirus 2 NEGATIVE NEGATIVE    Comment: (NOTE) If result is  NEGATIVE SARS-CoV-2 target nucleic acids are NOT DETECTED. The SARS-CoV-2 RNA is generally detectable in upper and lower  respiratory specimens during the acute phase of infection. The lowest  concentration of SARS-CoV-2 viral copies this assay can detect is 250  copies / mL. A negative result does not preclude SARS-CoV-2 infection  and should not be used as the sole basis for treatment or other  patient management decisions.  A negative result may occur with  improper specimen collection / handling, submission of specimen other  than nasopharyngeal swab, presence of viral mutation(s) within the  areas targeted by this assay, and inadequate number of viral copies  (<250 copies / mL). A negative result must be combined with clinical  observations, patient history, and epidemiological information. If result is POSITIVE SARS-CoV-2 target nucleic acids are DETECTED. The SARS-CoV-2 RNA is generally detectable in upper and lower  respiratory specimens dur ing the acute phase of infection.  Positive  results are indicative of active infection with SARS-CoV-2.  Clinical  correlation with patient history and other diagnostic information is  necessary to determine patient infection status.  Positive results do  not rule out bacterial infection or co-infection with other viruses. If result is PRESUMPTIVE POSTIVE SARS-CoV-2 nucleic acids MAY BE PRESENT.   A presumptive positive result was obtained on the submitted specimen  and confirmed on repeat testing.  While 2019 novel coronavirus  (SARS-CoV-2) nucleic acids may be present in the submitted sample  additional confirmatory testing may be necessary for epidemiological  and / or clinical management purposes  to differentiate between  SARS-CoV-2 and other Sarbecovirus currently known to infect humans.  If clinically indicated additional testing with an alternate test  methodology [phone]) is advised. The SARS-CoV-2 RNA is generally  detectable  in upper and lower respiratory sp ecimens during the acute  phase of infection. The expected result is Negative. Fact Sheet for Patients:  BoilerBrush.com.cy Fact Sheet for Healthcare Providers: https://pope.com/ This test is not yet approved or cleared by the [place] FDA and has been authorized for detection and/or diagnosis of SARS-CoV-2 by FDA under an Emergency Use Authorization (EUA).  This EUA will remain in effect (meaning this test can be used) for the duration of the COVID-19 declaration under Section 564(b)(1) of the Act, 21 U.S.C. section 360bbb-3(b)(1), unless the authorization is terminated or revoked sooner. Performed at [facility], [address] [place], [place] [address]     Chemistries  Recent Labs  Lab 08/25/19 0809 08/31/19 1755  NA 135 137  K  --  3.6  CL  --  106  CO2  --  23  GLUCOSE  --  97  BUN  --  8  CREATININE  --  0.73  CALCIUM  --  8.3*  AST  --  7*  ALT  --  11  ALKPHOS  --  80  BILITOT  --  0.3   ------------------------------------------------------------------------------------------------------------------  ------------------------------------------------------------------------------------------------------------------ GFR: Estimated Creatinine Clearance: 83.1 mL/min (by C-G formula based on SCr of 0.73 mg/dL). Liver Function Tests: Recent Labs  Lab 08/31/19 1755  AST 7*  ALT 11  ALKPHOS 80  BILITOT 0.3  PROT 6.8  ALBUMIN 3.7    --------------------------------------------------------------------------------------------------------------- Urine analysis:    Component Value Date/Time   COLORURINE COLORLESS (A) 08/31/2019 1737   APPEARANCEUR CLEAR 08/31/2019 1737   LABSPEC 1.002 (L) 08/31/2019 1737   PHURINE 7.0 08/31/2019 1737   GLUCOSEU NEGATIVE 08/31/2019 1737   HGBUR NEGATIVE 08/31/2019 1737   BILIRUBINUR NEGATIVE 08/31/2019 1737   KETONESUR NEGATIVE 08/31/2019  1737   PROTEINUR NEGATIVE 08/31/2019 1737   NITRITE NEGATIVE 08/31/2019 1737   LEUKOCYTESUR NEGATIVE 08/31/2019 1737      Imaging Results:    Ct Head Wo Contrast  Result Date: 08/31/2019 CLINICAL DATA:  Altered level of consciousness. EXAM: CT HEAD WITHOUT CONTRAST TECHNIQUE: Contiguous axial images were obtained from the base of the skull through the vertex without intravenous contrast. COMPARISON:  CT head without contrast 08/21/2019 FINDINGS: Brain: No acute infarct, hemorrhage, or mass lesion is present. No significant white matter lesions are present. The ventricles are of normal size. No significant extraaxial fluid collection is present. Vascular: No hyperdense vessel or unexpected calcification. Skull: Calvarium is intact. No focal lytic or blastic lesions are present. Sinuses/Orbits: The paranasal sinuses and mastoid air cells are clear. The globes and orbits are within normal limits. IMPRESSION: Normal CT of the head. Electronically Signed   By: Marin Roberts M.D.   On: 08/31/2019 18:51   Dg Chest Portable  1 View  Result Date: 08/31/2019 CLINICAL DATA:  Weakness, confusing combative EXAM: PORTABLE CHEST 1 VIEW COMPARISON:  Radiograph 08/21/2019 FINDINGS: Diffuse hazy interstitial opacity with basilar predominance. Vascular cephalization is noted. Mild more consolidative opacity is noted in the periphery of the right lung base. Cardiomediastinal contours are unremarkable for the portable technique. Low positioning of the left humeral head relative to the glenoid, possibly positional. No other acute osseous or soft tissue abnormality. IMPRESSION: 1. Diffuse hazy interstitial opacity with basilar predominance, consistent with interstitial edema. 2. More consolidative opacity in the periphery of the right lung base, could reflect alveolar edema or infection. 3. Low positioning of the left humeral head relative to the glenoid, correlate with shoulder symptoms. Consider dedicated  radiographs. Electronically Signed   By: Kreg Shropshire M.D.   On: 08/31/2019 23:47    My personal review of EKG: Rhythm NSR, no ST changes.   Assessment & Plan:    Active Problems:   CAP (community acquired pneumonia)   1. Community-acquired pneumonia-chest x-ray shows hazy interstitial opacity in right lung base consistent with infection.  BNP 31.0.  Started on ceftriaxone and Zithromax.  Will continue with antibiotics.  2. Altered mental status-slowly improving, patient got Geodon in the ED.  Currently safety sitter one-to-one, patient is IVC.  She will need psychiatric evaluation once medically stable.  3. Hypertension-blood pressure stable.  4. COPD-continue bronchodilators    DVT Prophylaxis-   Lovenox   AM Labs Ordered, also please review Full Orders  Family Communication: Admission, patients condition and plan of care including tests being ordered have been discussed with the patient  who indicate understanding and agree with the plan and Code Status.  Code Status: Full code  Admission status: Inpatient: Based on patients clinical presentation and evaluation of above clinical data, I have made determination that patient meets Inpatient criteria at this time.  Time spent in minutes : 60 minutes   Meredeth Ide M.D on 09/01/2019 at 5:24 AM

## 2019-09-01 NOTE — Progress Notes (Signed)
COVERAGE NOTE   09/01/2019 2:01 PM  Rebecca Liu was seen and examined.  The H&P by the admitting provider, orders, imaging was reviewed.  Please see new orders.  Will continue to follow.   Vitals:   09/01/19 0730 09/01/19 1025  BP: 112/74   Pulse: 86   Resp: 14   Temp:    SpO2: 96% 96%    Results for orders placed or performed during the hospital encounter of 08/31/19  SARS Coronavirus 2 by RT PCR (hospital order, performed in [facility] hospital lab) Nasopharyngeal Nasopharyngeal Swab   Specimen: Nasopharyngeal Swab  Result Value Ref Range   SARS Coronavirus 2 NEGATIVE NEGATIVE  CBC with Differential/Platelet  Result Value Ref Range   WBC 9.5 4.0 - 10.5 K/uL   RBC 4.08 3.87 - 5.11 MIL/uL   Hemoglobin 13.4 12.0 - 15.0 g/dL   HCT 59.7 41.6 - 38.4 %   MCV 98.8 80.0 - 100.0 fL   MCH 32.8 26.0 - 34.0 pg   MCHC 33.3 30.0 - 36.0 g/dL   RDW 53.6 46.8 - 03.2 %   Platelets 280 150 - 400 K/uL   nRBC 0.0 0.0 - 0.2 %   Neutrophils Relative % 78 %   Neutro Abs 7.5 1.7 - 7.7 K/uL   Lymphocytes Relative 16 %   Lymphs Abs 1.5 0.7 - 4.0 K/uL   Monocytes Relative 4 %   Monocytes Absolute 0.4 0.1 - 1.0 K/uL   Eosinophils Relative 1 %   Eosinophils Absolute 0.1 0.0 - 0.5 K/uL   Basophils Relative 1 %   Basophils Absolute 0.1 0.0 - 0.1 K/uL   Immature Granulocytes 0 %   Abs Immature Granulocytes 0.03 0.00 - 0.07 K/uL  Comprehensive metabolic panel  Result Value Ref Range   Sodium 137 135 - 145 mmol/L   Potassium 3.6 3.5 - 5.1 mmol/L   Chloride 106 98 - 111 mmol/L   CO2 23 22 - 32 mmol/L   Glucose, Bld 97 70 - 99 mg/dL   BUN 8 6 - 20 mg/dL   Creatinine, Ser 1.22 0.44 - 1.00 mg/dL   Calcium 8.3 (L) 8.9 - 10.3 mg/dL   Total Protein 6.8 6.5 - 8.1 g/dL   Albumin 3.7 3.5 - 5.0 g/dL   AST 7 (L) 15 - 41 U/L   ALT 11 0 - 44 U/L   Alkaline Phosphatase 80 38 - 126 U/L   Total Bilirubin 0.3 0.3 - 1.2 mg/dL   GFR calc non Af Amer >60 >60 mL/min   GFR calc Af Amer >60 >60 mL/min   Anion gap 8 5 - 15  Urinalysis, Routine w reflex microscopic  Result Value Ref Range   Color, Urine COLORLESS (A) YELLOW   APPearance CLEAR CLEAR   Specific Gravity, Urine 1.002 (L) 1.005 - 1.030   pH 7.0 5.0 - 8.0   Glucose, UA NEGATIVE NEGATIVE mg/dL   Hgb urine dipstick NEGATIVE NEGATIVE   Bilirubin Urine NEGATIVE NEGATIVE   Ketones, ur NEGATIVE NEGATIVE mg/dL   Protein, ur NEGATIVE NEGATIVE mg/dL   Nitrite NEGATIVE NEGATIVE   Leukocytes,Ua NEGATIVE NEGATIVE  Rapid urine drug screen (hospital performed)  Result Value Ref Range   Opiates NONE DETECTED NONE DETECTED   Cocaine NONE DETECTED NONE DETECTED   Benzodiazepines NONE DETECTED NONE DETECTED   Amphetamines NONE DETECTED NONE DETECTED   Tetrahydrocannabinol NONE DETECTED NONE DETECTED   Barbiturates NONE DETECTED NONE DETECTED  Ethanol  Result Value Ref Range   Alcohol, Ethyl (B) <  10 <10 mg/dL  Pregnancy, urine  Result Value Ref Range   Preg Test, Ur NEGATIVE NEGATIVE  Brain natriuretic peptide  Result Value Ref Range   B Natriuretic Peptide 31.0 0.0 - 100.0 pg/mL  Basic metabolic panel  Result Value Ref Range   Sodium 145 135 - 145 mmol/L   Potassium 3.8 3.5 - 5.1 mmol/L   Chloride 110 98 - 111 mmol/L   CO2 26 22 - 32 mmol/L   Glucose, Bld 94 70 - 99 mg/dL   BUN 8 6 - 20 mg/dL   Creatinine, Ser 0.64 0.44 - 1.00 mg/dL   Calcium 8.7 (L) 8.9 - 10.3 mg/dL   GFR calc non Af Amer >60 >60 mL/min   GFR calc Af Amer >60 >60 mL/min   Anion gap 9 5 - 15     C. Wynetta Emery, MD [facility]   08/31/2019  4:40 PM How to contact the [facility] Attending or Consulting provider [place] or covering provider during after hours [place], for this patient?  1. Check the care team in [facility] and look for a) attending/consulting TRH provider listed and b) the [facility] team listed 2. Log into [email or web address] and use [place]'s universal password to access. If you do not have the password, please contact the hospital operator. 3. Locate the [facility]  provider you are looking for under [facility] and page to a number that you can be directly reached. 4. If you still have difficulty reaching the provider, please page the [facility] (Director on Call) for the Hospitalists listed on amion for assistance.

## 2019-09-01 NOTE — ED Provider Notes (Signed)
Patient awaiting psych evaluation.  Patient noted to be tachycardic and had an abnormal chest x-ray.  COVID-19 testing has been ordered, BMP has been ordered.  Rectal temp is 98.  Patient is resting comfortably after Geodon.  She follows commands and move all extremities.   EKG Interpretation  Date/Time:  Thursday September 01 2019 00:12:33 EDT Ventricular Rate:  118 PR Interval:  144 QRS Duration: 72 QT Interval:  334 QTC Calculation: 468 R Axis:   83 Text Interpretation:  Sinus tachycardia Cannot rule out Anterior infarct , age undetermined Abnormal ECG Confirmed by Ripley Fraise [id number] on 09/01/2019 12:31:37 AM         Ripley Fraise, MD 09/01/19 [phone]

## 2019-09-01 NOTE — ED Notes (Signed)
ED TO INPATIENT HANDOFF REPORT  ED Nurse Name and Phone #:  Merleen Milliner, RN [phone]  S Name/Age/Gender Rebecca Liu 53 y.o. female Room/Bed: APA03/APA03  Code Status   Code Status: Full Code  Home/SNF/Other Home Patient oriented to: self Is this baseline? Yes   Triage Complete: Triage complete  Chief Complaint Altered Mental Status  Triage Note Patient's spouse states patient was admitted here recently for low sodium and potassium. States "she has lost her memory since she left here and we took her to her doctor today and he said to get to the hospital right away." Patient confused, hollering, and aggressive in triage.    Allergies Allergies  Allergen Reactions  . Wellbutrin [Bupropion] Other (See Comments)    Reaction is not known per spouse    Level of Care/Admitting Diagnosis ED Disposition    ED Disposition Condition Comment   Admit  Hospital Area: [facility] [[id number]]  Level of Care: Med-Surg [16]  Covid Evaluation: Confirmed COVID Negative  Diagnosis: CAP (community acquired pneumonia) [960454]  Admitting Physician: Meredeth Ide [[id number]]  Attending Physician: Meredeth Ide [[id number]]  Estimated length of stay: 3 - 4 days  Certification:: I certify this patient will need inpatient services for at least 2 midnights  PT Class (Do Not Modify): Inpatient [101]  PT Acc Code (Do Not Modify): Private [1]       B Medical/Surgery History Past Medical History:  Diagnosis Date  . COPD (chronic obstructive pulmonary disease) (HCC)   . Depression    History reviewed. No pertinent surgical history.   A IV Location/Drains/Wounds Patient Lines/Drains/Airways Status   Active Line/Drains/Airways    Name:   Placement date:   Placement time:   Site:   Days:   Peripheral IV 09/01/19 Right;Anterior Forearm   09/01/19    0221    Forearm   less than 1          Intake/Output Last 24 hours  Intake/Output Summary (Last 24 hours) at 09/01/2019 1502 Last data  filed at 09/01/2019 0841 Gross per 24 hour  Intake 240 ml  Output -  Net 240 ml    Labs/Imaging Results for orders placed or performed during the hospital encounter of 08/31/19 (from the past 48 hour(s))  Urinalysis, Routine w reflex microscopic     Status: Abnormal   Collection Time: 08/31/19  5:37 PM  Result Value Ref Range   Color, Urine COLORLESS (A) YELLOW   APPearance CLEAR CLEAR   Specific Gravity, Urine 1.002 (L) 1.005 - 1.030   pH 7.0 5.0 - 8.0   Glucose, UA NEGATIVE NEGATIVE mg/dL   Hgb urine dipstick NEGATIVE NEGATIVE   Bilirubin Urine NEGATIVE NEGATIVE   Ketones, ur NEGATIVE NEGATIVE mg/dL   Protein, ur NEGATIVE NEGATIVE mg/dL   Nitrite NEGATIVE NEGATIVE   Leukocytes,Ua NEGATIVE NEGATIVE    Comment: Performed at [facility], [address], [place], [place] [address]  CBC with Differential/Platelet     Status: None   Collection Time: 08/31/19  5:55 PM  Result Value Ref Range   WBC 9.5 4.0 - 10.5 K/uL   RBC 4.08 3.87 - 5.11 MIL/uL   Hemoglobin 13.4 12.0 - 15.0 g/dL   HCT 91.4 78.2 - 95.6 %   MCV 98.8 80.0 - 100.0 fL   MCH 32.8 26.0 - 34.0 pg   MCHC 33.3 30.0 - 36.0 g/dL   RDW 21.3 08.6 - 57.8 %   Platelets 280 150 - 400 K/uL   nRBC 0.0  0.0 - 0.2 %   Neutrophils Relative % 78 %   Neutro Abs 7.5 1.7 - 7.7 K/uL   Lymphocytes Relative 16 %   Lymphs Abs 1.5 0.7 - 4.0 K/uL   Monocytes Relative 4 %   Monocytes Absolute 0.4 0.1 - 1.0 K/uL   Eosinophils Relative 1 %   Eosinophils Absolute 0.1 0.0 - 0.5 K/uL   Basophils Relative 1 %   Basophils Absolute 0.1 0.0 - 0.1 K/uL   Immature Granulocytes 0 %   Abs Immature Granulocytes 0.03 0.00 - 0.07 K/uL    Comment: Performed at [facility], [address] [place], [place] [address]  Comprehensive metabolic panel     Status: Abnormal   Collection Time: 08/31/19  5:55 PM  Result Value Ref Range   Sodium 137 135 - 145 mmol/L   Potassium 3.6 3.5 - 5.1 mmol/L   Chloride 106 98 - 111 mmol/L   CO2 23 22 - 32 mmol/L    Glucose, Bld 97 70 - 99 mg/dL   BUN 8 6 - 20 mg/dL   Creatinine, Ser 6.040.73 0.44 - 1.00 mg/dL   Calcium 8.3 (L) 8.9 - 10.3 mg/dL   Total Protein 6.8 6.5 - 8.1 g/dL   Albumin 3.7 3.5 - 5.0 g/dL   AST 7 (L) 15 - 41 U/L   ALT 11 0 - 44 U/L   Alkaline Phosphatase 80 38 - 126 U/L   Total Bilirubin 0.3 0.3 - 1.2 mg/dL   GFR calc non Af Amer >60 >60 mL/min   GFR calc Af Amer >60 >60 mL/min   Anion gap 8 5 - 15    Comment: Performed at [facility], [address] [place], [place] [address]  Ethanol     Status: None   Collection Time: 08/31/19  5:55 PM  Result Value Ref Range   Alcohol, Ethyl (B) <10 <10 mg/dL    Comment: (NOTE) Lowest detectable limit for serum alcohol is 10 mg/dL. For medical purposes only. Performed at [facility], [address] [place], [place] [address]   Brain natriuretic peptide     Status: None   Collection Time: 08/31/19  5:56 PM  Result Value Ref Range   B Natriuretic Peptide 31.0 0.0 - 100.0 pg/mL    Comment: Performed at [facility], [address] [place], [place] [address]  Rapid urine drug screen (hospital performed)     Status: None   Collection Time: 08/31/19  6:24 PM  Result Value Ref Range   Opiates NONE DETECTED NONE DETECTED   Cocaine NONE DETECTED NONE DETECTED   Benzodiazepines NONE DETECTED NONE DETECTED   Amphetamines NONE DETECTED NONE DETECTED   Tetrahydrocannabinol NONE DETECTED NONE DETECTED   Barbiturates NONE DETECTED NONE DETECTED    Comment: (NOTE) DRUG SCREEN FOR MEDICAL PURPOSES ONLY.  IF CONFIRMATION IS NEEDED FOR ANY PURPOSE, NOTIFY LAB WITHIN 5 DAYS. LOWEST DETECTABLE LIMITS FOR URINE DRUG SCREEN Drug Class                     Cutoff (ng/mL) Amphetamine and metabolites    1000 Barbiturate and metabolites    200 Benzodiazepine                 200 Tricyclics and metabolites     300 Opiates and metabolites        300 Cocaine and metabolites        300 THC  50 Performed at [facility],  [address] [place], [place] [address]   Pregnancy, urine     Status: None   Collection Time: 08/31/19 11:15 PM  Result Value Ref Range   Preg Test, Ur NEGATIVE NEGATIVE    Comment:        THE SENSITIVITY OF THIS METHODOLOGY IS >20 mIU/mL. Performed at [facility], [address] [place], [place] [address]   SARS Coronavirus 2 by RT PCR (hospital order, performed in [facility] hospital lab) Nasopharyngeal Nasopharyngeal Swab     Status: None   Collection Time: 09/01/19 12:05 AM   Specimen: Nasopharyngeal Swab  Result Value Ref Range   SARS Coronavirus 2 NEGATIVE NEGATIVE    Comment: (NOTE) If result is NEGATIVE SARS-CoV-2 target nucleic acids are NOT DETECTED. The SARS-CoV-2 RNA is generally detectable in upper and lower  respiratory specimens during the acute phase of infection. The lowest  concentration of SARS-CoV-2 viral copies this assay can detect is 250  copies / mL. A negative result does not preclude SARS-CoV-2 infection  and should not be used as the sole basis for treatment or other  patient management decisions.  A negative result may occur with  improper specimen collection / handling, submission of specimen other  than nasopharyngeal swab, presence of viral mutation(s) within the  areas targeted by this assay, and inadequate number of viral copies  (<250 copies / mL). A negative result must be combined with clinical  observations, patient history, and epidemiological information. If result is POSITIVE SARS-CoV-2 target nucleic acids are DETECTED. The SARS-CoV-2 RNA is generally detectable in upper and lower  respiratory specimens dur ing the acute phase of infection.  Positive  results are indicative of active infection with SARS-CoV-2.  Clinical  correlation with patient history and other diagnostic information is  necessary to determine patient infection status.  Positive results do  not rule out bacterial infection or co-infection with other viruses. If result is  PRESUMPTIVE POSTIVE SARS-CoV-2 nucleic acids MAY BE PRESENT.   A presumptive positive result was obtained on the submitted specimen  and confirmed on repeat testing.  While 2019 novel coronavirus  (SARS-CoV-2) nucleic acids may be present in the submitted sample  additional confirmatory testing may be necessary for epidemiological  and / or clinical management purposes  to differentiate between  SARS-CoV-2 and other Sarbecovirus currently known to infect humans.  If clinically indicated additional testing with an alternate test  methodology [phone]) is advised. The SARS-CoV-2 RNA is generally  detectable in upper and lower respiratory sp ecimens during the acute  phase of infection. The expected result is Negative. Fact Sheet for Patients:  BoilerBrush.com.cy Fact Sheet for Healthcare Providers: https://pope.com/ This test is not yet approved or cleared by the [place] FDA and has been authorized for detection and/or diagnosis of SARS-CoV-2 by FDA under an Emergency Use Authorization (EUA).  This EUA will remain in effect (meaning this test can be used) for the duration of the COVID-19 declaration under Section 564(b)(1) of the Act, 21 U.S.C. section 360bbb-3(b)(1), unless the authorization is terminated or revoked sooner. Performed at [facility], [address], [place], [place] [address]   Basic metabolic panel     Status: Abnormal   Collection Time: 09/01/19  8:38 AM  Result Value Ref Range   Sodium 145 135 - 145 mmol/L    Comment: DELTA CHECK NOTED   Potassium 3.8 3.5 - 5.1 mmol/L   Chloride 110 98 - 111 mmol/L  CO2 26 22 - 32 mmol/L   Glucose, Bld 94 70 - 99 mg/dL   BUN 8 6 - 20 mg/dL   Creatinine, Ser 2.40 0.44 - 1.00 mg/dL   Calcium 8.7 (L) 8.9 - 10.3 mg/dL   GFR calc non Af Amer >60 >60 mL/min   GFR calc Af Amer >60 >60 mL/min   Anion gap 9 5 - 15    Comment: Performed at [facility], [address]  [place], [place] [address]   Ct Head Wo Contrast  Result Date: 08/31/2019 CLINICAL DATA:  Altered level of consciousness. EXAM: CT HEAD WITHOUT CONTRAST TECHNIQUE: Contiguous axial images were obtained from the base of the skull through the vertex without intravenous contrast. COMPARISON:  CT head without contrast 08/21/2019 FINDINGS: Brain: No acute infarct, hemorrhage, or mass lesion is present. No significant white matter lesions are present. The ventricles are of normal size. No significant extraaxial fluid collection is present. Vascular: No hyperdense vessel or unexpected calcification. Skull: Calvarium is intact. No focal lytic or blastic lesions are present. Sinuses/Orbits: The paranasal sinuses and mastoid air cells are clear. The globes and orbits are within normal limits. IMPRESSION: Normal CT of the head. Electronically Signed   By: Marin Roberts M.D.   On: 08/31/2019 18:51   Dg Chest Port 1 View  Result Date: 09/01/2019 CLINICAL DATA:  Cough, COPD. EXAM: PORTABLE CHEST 1 VIEW COMPARISON:  08/31/2019 FINDINGS: Developing opacity at the left lung base since prior study. Mild interstitial prominence. With accentuated minor fissure in the right chest, not present the previous study. Cardiomediastinal contours are normal. No acute bone finding. IMPRESSION: Shifting opacities in the chest now with left lower lobe opacity, consider shifting atelectasis or developing left lower lobe consolidation. Interstitial prominence may reflect interstitial edema scarring is noted along the minor fissure in the right chest. Electronically Signed   By: Donzetta Kohut M.D.   On: 09/01/2019 08:17   Dg Chest Portable 1 View  Result Date: 08/31/2019 CLINICAL DATA:  Weakness, confusing combative EXAM: PORTABLE CHEST 1 VIEW COMPARISON:  Radiograph 08/21/2019 FINDINGS: Diffuse hazy interstitial opacity with basilar predominance. Vascular cephalization is noted. Mild more consolidative opacity is noted in the  periphery of the right lung base. Cardiomediastinal contours are unremarkable for the portable technique. Low positioning of the left humeral head relative to the glenoid, possibly positional. No other acute osseous or soft tissue abnormality. IMPRESSION: 1. Diffuse hazy interstitial opacity with basilar predominance, consistent with interstitial edema. 2. More consolidative opacity in the periphery of the right lung base, could reflect alveolar edema or infection. 3. Low positioning of the left humeral head relative to the glenoid, correlate with shoulder symptoms. Consider dedicated radiographs. Electronically Signed   By: Kreg Shropshire M.D.   On: 08/31/2019 23:47    Pending Labs Unresulted Labs (From admission, onward)    Start     Ordered   09/08/19 0500  Creatinine, serum  (enoxaparin (LOVENOX)    CrCl >/= 30 ml/min)  Weekly,   R    Comments: while on enoxaparin therapy    09/01/19 0529          Vitals/Pain Today's Vitals   09/01/19 0630 09/01/19 0730 09/01/19 1025 09/01/19 1300  BP: 105/65 112/74  124/87  Pulse: 85 86  82  Resp:  14  18  Temp:    99.1 F (37.3 C)  TempSrc:      SpO2: 97% 96% 96% 98%  PainSc:  Isolation Precautions No active isolations  Medications Medications  escitalopram (LEXAPRO) tablet 10 mg (0 mg Oral Hold 09/01/19 1039)  fluticasone furoate-vilanterol (BREO ELLIPTA) 100-25 MCG/INH 1 puff (1 puff Inhalation Given 09/01/19 1024)    And  umeclidinium bromide (INCRUSE ELLIPTA) 62.5 MCG/INH 1 puff (1 puff Inhalation Given 09/01/19 1024)  enoxaparin (LOVENOX) injection 40 mg (0 mg Subcutaneous Hold 09/01/19 1039)  0.9 %  sodium chloride infusion (has no administration in time range)  acetaminophen (TYLENOL) tablet 650 mg (has no administration in time range)    Or  acetaminophen (TYLENOL) suppository 650 mg (has no administration in time range)  cefTRIAXone (ROCEPHIN) 1 g in sodium chloride 0.9 % 100 mL IVPB (has no administration in time range)   azithromycin (ZITHROMAX) 500 mg in sodium chloride 0.9 % 250 mL IVPB (has no administration in time range)  ondansetron (ZOFRAN) tablet 4 mg (has no administration in time range)    Or  ondansetron (ZOFRAN) injection 4 mg (has no administration in time range)  haloperidol lactate (HALDOL) injection 5 mg (has no administration in time range)  QUEtiapine (SEROQUEL) tablet 25 mg (has no administration in time range)  sodium chloride 0.9 % bolus 500 mL (0 mLs Intravenous Stopped 08/31/19 1954)  ziprasidone (GEODON) injection 10 mg (10 mg Intramuscular Given 08/31/19 2107)  sterile water (preservative free) injection (  Given 08/31/19 2122)  cefTRIAXone (ROCEPHIN) 1 g in sodium chloride 0.9 % 100 mL IVPB (0 g Intravenous Stopped 09/01/19 0256)  azithromycin (ZITHROMAX) 500 mg in sodium chloride 0.9 % 250 mL IVPB (0 mg Intravenous Stopped 09/01/19 0357)    Mobility walks High fall risk   Focused Assessments Neuro Assessment Handoff:  Swallow screen pass? N/A         R Recommendations: See Admitting Provider Note  Report given to:   Additional Notes: N/A

## 2019-09-01 NOTE — ED Notes (Signed)
ED TO INPATIENT HANDOFF REPORT  ED Nurse Name and Phone #: [phone]  S Name/Age/Gender Rebecca Liu 53 y.o. female Room/Bed: APA03/APA03  Code Status   Code Status: Full Code  Home/SNF/Other Home Patient oriented to: self Is this baseline? No   Triage Complete: Triage complete  Chief Complaint Altered Mental Status  Triage Note Patient's spouse states patient was admitted here recently for low sodium and potassium. States "she has lost her memory since she left here and we took her to her doctor today and he said to get to the hospital right away." Patient confused, hollering, and aggressive in triage.    Allergies Allergies  Allergen Reactions  . Wellbutrin [Bupropion] Other (See Comments)    Reaction is not known per spouse    Level of Care/Admitting Diagnosis ED Disposition    ED Disposition Condition Comment   Admit  Hospital Area: [facility] [[id number]]  Level of Care: Med-Surg [16]  Covid Evaluation: Confirmed COVID Negative  Diagnosis: CAP (community acquired pneumonia) [440102]  Admitting Physician: Meredeth Ide [[id number]]  Attending Physician: Meredeth Ide [[id number]]  Estimated length of stay: 3 - 4 days  Certification:: I certify this patient will need inpatient services for at least 2 midnights  PT Class (Do Not Modify): Inpatient [101]  PT Acc Code (Do Not Modify): Private [1]       B Medical/Surgery History Past Medical History:  Diagnosis Date  . COPD (chronic obstructive pulmonary disease) (HCC)   . Depression    History reviewed. No pertinent surgical history.   A IV Location/Drains/Wounds Patient Lines/Drains/Airways Status   Active Line/Drains/Airways    Name:   Placement date:   Placement time:   Site:   Days:   Peripheral IV 09/01/19 Right;Anterior Forearm   09/01/19    0221    Forearm   less than 1          Intake/Output Last 24 hours No intake or output data in the 24 hours ending 09/01/19  0531  Labs/Imaging Results for orders placed or performed during the hospital encounter of 08/31/19 (from the past 48 hour(s))  Urinalysis, Routine w reflex microscopic     Status: Abnormal   Collection Time: 08/31/19  5:37 PM  Result Value Ref Range   Color, Urine COLORLESS (A) YELLOW   APPearance CLEAR CLEAR   Specific Gravity, Urine 1.002 (L) 1.005 - 1.030   pH 7.0 5.0 - 8.0   Glucose, UA NEGATIVE NEGATIVE mg/dL   Hgb urine dipstick NEGATIVE NEGATIVE   Bilirubin Urine NEGATIVE NEGATIVE   Ketones, ur NEGATIVE NEGATIVE mg/dL   Protein, ur NEGATIVE NEGATIVE mg/dL   Nitrite NEGATIVE NEGATIVE   Leukocytes,Ua NEGATIVE NEGATIVE    Comment: Performed at [facility], [address] [place], [place] [address]  CBC with Differential/Platelet     Status: None   Collection Time: 08/31/19  5:55 PM  Result Value Ref Range   WBC 9.5 4.0 - 10.5 K/uL   RBC 4.08 3.87 - 5.11 MIL/uL   Hemoglobin 13.4 12.0 - 15.0 g/dL   HCT 64.4 03.4 - 74.2 %   MCV 98.8 80.0 - 100.0 fL   MCH 32.8 26.0 - 34.0 pg   MCHC 33.3 30.0 - 36.0 g/dL   RDW 59.5 63.8 - 75.6 %   Platelets 280 150 - 400 K/uL   nRBC 0.0 0.0 - 0.2 %   Neutrophils Relative % 78 %   Neutro Abs 7.5 1.7 - 7.7 K/uL   Lymphocytes Relative  16 %   Lymphs Abs 1.5 0.7 - 4.0 K/uL   Monocytes Relative 4 %   Monocytes Absolute 0.4 0.1 - 1.0 K/uL   Eosinophils Relative 1 %   Eosinophils Absolute 0.1 0.0 - 0.5 K/uL   Basophils Relative 1 %   Basophils Absolute 0.1 0.0 - 0.1 K/uL   Immature Granulocytes 0 %   Abs Immature Granulocytes 0.03 0.00 - 0.07 K/uL    Comment: Performed at [facility], [address], [place], [place] [address]  Comprehensive metabolic panel     Status: Abnormal   Collection Time: 08/31/19  5:55 PM  Result Value Ref Range   Sodium 137 135 - 145 mmol/L   Potassium 3.6 3.5 - 5.1 mmol/L   Chloride 106 98 - 111 mmol/L   CO2 23 22 - 32 mmol/L   Glucose, Bld 97 70 - 99 mg/dL   BUN 8 6 - 20 mg/dL   Creatinine, Ser 0.73 0.44 -  1.00 mg/dL   Calcium 8.3 (L) 8.9 - 10.3 mg/dL   Total Protein 6.8 6.5 - 8.1 g/dL   Albumin 3.7 3.5 - 5.0 g/dL   AST 7 (L) 15 - 41 U/L   ALT 11 0 - 44 U/L   Alkaline Phosphatase 80 38 - 126 U/L   Total Bilirubin 0.3 0.3 - 1.2 mg/dL   GFR calc non Af Amer >60 >60 mL/min   GFR calc Af Amer >60 >60 mL/min   Anion gap 8 5 - 15    Comment: Performed at [facility], [address], [place], [place] [address]  Ethanol     Status: None   Collection Time: 08/31/19  5:55 PM  Result Value Ref Range   Alcohol, Ethyl (B) <10 <10 mg/dL    Comment: (NOTE) Lowest detectable limit for serum alcohol is 10 mg/dL. For medical purposes only. Performed at [facility], [address], [place], [place] [address]   Brain natriuretic peptide     Status: None   Collection Time: 08/31/19  5:56 PM  Result Value Ref Range   B Natriuretic Peptide 31.0 0.0 - 100.0 pg/mL    Comment: Performed at [facility], [address], [place], [place] [address]  Rapid urine drug screen (hospital performed)     Status: None   Collection Time: 08/31/19  6:24 PM  Result Value Ref Range   Opiates NONE DETECTED NONE DETECTED   Cocaine NONE DETECTED NONE DETECTED   Benzodiazepines NONE DETECTED NONE DETECTED   Amphetamines NONE DETECTED NONE DETECTED   Tetrahydrocannabinol NONE DETECTED NONE DETECTED   Barbiturates NONE DETECTED NONE DETECTED    Comment: (NOTE) DRUG SCREEN FOR MEDICAL PURPOSES ONLY.  IF CONFIRMATION IS NEEDED FOR ANY PURPOSE, NOTIFY LAB WITHIN 5 DAYS. LOWEST DETECTABLE LIMITS FOR URINE DRUG SCREEN Drug Class                     Cutoff (ng/mL) Amphetamine and metabolites    1000 Barbiturate and metabolites    200 Benzodiazepine                 268 Tricyclics and metabolites     300 Opiates and metabolites        300 Cocaine and metabolites        300 THC                            50 Performed at [facility], [place]  [address] [place], [place] [address]   Pregnancy, urine     Status: None    Collection Time: 08/31/19 11:15 PM  Result Value Ref Range   Preg Test, Ur NEGATIVE NEGATIVE    Comment:        THE SENSITIVITY OF THIS METHODOLOGY IS >20 mIU/mL. Performed at [facility], [address] [place], [place] [address]   SARS Coronavirus 2 by RT PCR (hospital order, performed in [facility] hospital lab) Nasopharyngeal Nasopharyngeal Swab     Status: None   Collection Time: 09/01/19 12:05 AM   Specimen: Nasopharyngeal Swab  Result Value Ref Range   SARS Coronavirus 2 NEGATIVE NEGATIVE    Comment: (NOTE) If result is NEGATIVE SARS-CoV-2 target nucleic acids are NOT DETECTED. The SARS-CoV-2 RNA is generally detectable in upper and lower  respiratory specimens during the acute phase of infection. The lowest  concentration of SARS-CoV-2 viral copies this assay can detect is 250  copies / mL. A negative result does not preclude SARS-CoV-2 infection  and should not be used as the sole basis for treatment or other  patient management decisions.  A negative result may occur with  improper specimen collection / handling, submission of specimen other  than nasopharyngeal swab, presence of viral mutation(s) within the  areas targeted by this assay, and inadequate number of viral copies  (<250 copies / mL). A negative result must be combined with clinical  observations, patient history, and epidemiological information. If result is POSITIVE SARS-CoV-2 target nucleic acids are DETECTED. The SARS-CoV-2 RNA is generally detectable in upper and lower  respiratory specimens dur ing the acute phase of infection.  Positive  results are indicative of active infection with SARS-CoV-2.  Clinical  correlation with patient history and other diagnostic information is  necessary to determine patient infection status.  Positive results do  not rule out bacterial infection or co-infection with other viruses. If result is PRESUMPTIVE POSTIVE SARS-CoV-2 nucleic acids MAY BE PRESENT.   A  presumptive positive result was obtained on the submitted specimen  and confirmed on repeat testing.  While 2019 novel coronavirus  (SARS-CoV-2) nucleic acids may be present in the submitted sample  additional confirmatory testing may be necessary for epidemiological  and / or clinical management purposes  to differentiate between  SARS-CoV-2 and other Sarbecovirus currently known to infect humans.  If clinically indicated additional testing with an alternate test  methodology [phone]) is advised. The SARS-CoV-2 RNA is generally  detectable in upper and lower respiratory sp ecimens during the acute  phase of infection. The expected result is Negative. Fact Sheet for Patients:  BoilerBrush.com.cy Fact Sheet for Healthcare Providers: https://pope.com/ This test is not yet approved or cleared by the [place] FDA and has been authorized for detection and/or diagnosis of SARS-CoV-2 by FDA under an Emergency Use Authorization (EUA).  This EUA will remain in effect (meaning this test can be used) for the duration of the COVID-19 declaration under Section 564(b)(1) of the Act, 21 U.S.C. section 360bbb-3(b)(1), unless the authorization is terminated or revoked sooner. Performed at [facility], [address], [place], [place] [address]    Ct Head Wo Contrast  Result Date: 08/31/2019 CLINICAL DATA:  Altered level of consciousness. EXAM: CT HEAD WITHOUT CONTRAST TECHNIQUE: Contiguous axial images were obtained from the base of the skull through the vertex without intravenous contrast. COMPARISON:  CT head without contrast 08/21/2019 FINDINGS: Brain: No acute infarct, hemorrhage, or mass lesion is present. No significant white matter lesions are  present. The ventricles are of normal size. No significant extraaxial fluid collection is present. Vascular: No hyperdense vessel or unexpected calcification. Skull: Calvarium is intact. No focal lytic or  blastic lesions are present. Sinuses/Orbits: The paranasal sinuses and mastoid air cells are clear. The globes and orbits are within normal limits. IMPRESSION: Normal CT of the head. Electronically Signed   By: Marin Robertshristopher  Mattern M.D.   On: 08/31/2019 18:51   Dg Chest Portable 1 View  Result Date: 08/31/2019 CLINICAL DATA:  Weakness, confusing combative EXAM: PORTABLE CHEST 1 VIEW COMPARISON:  Radiograph 08/21/2019 FINDINGS: Diffuse hazy interstitial opacity with basilar predominance. Vascular cephalization is noted. Mild more consolidative opacity is noted in the periphery of the right lung base. Cardiomediastinal contours are unremarkable for the portable technique. Low positioning of the left humeral head relative to the glenoid, possibly positional. No other acute osseous or soft tissue abnormality. IMPRESSION: 1. Diffuse hazy interstitial opacity with basilar predominance, consistent with interstitial edema. 2. More consolidative opacity in the periphery of the right lung base, could reflect alveolar edema or infection. 3. Low positioning of the left humeral head relative to the glenoid, correlate with shoulder symptoms. Consider dedicated radiographs. Electronically Signed   By: Kreg ShropshirePrice  DeHay M.D.   On: 08/31/2019 23:47    Pending Labs Unresulted Labs (From admission, onward)    Start     Ordered   09/08/19 0500  Creatinine, serum  (enoxaparin (LOVENOX)    CrCl >/= 30 ml/min)  Weekly,   R    Comments: while on enoxaparin therapy    09/01/19 0529   09/02/19 0500  CBC  Tomorrow morning,   R     09/01/19 0529   09/02/19 0500  Comprehensive metabolic panel  Tomorrow morning,   R     09/01/19 0529   09/01/19 0530  CBC  (enoxaparin (LOVENOX)    CrCl >/= 30 ml/min)  Once,   STAT    Comments: Baseline for enoxaparin therapy IF NOT ALREADY DRAWN.  Notify MD if PLT < 100 K.    09/01/19 0529   09/01/19 0530  Creatinine, serum  (enoxaparin (LOVENOX)    CrCl >/= 30 ml/min)  Once,   STAT     Comments: Baseline for enoxaparin therapy IF NOT ALREADY DRAWN.    09/01/19 0529          Vitals/Pain Today's Vitals   09/01/19 0138 09/01/19 0149 09/01/19 0300 09/01/19 0500  BP:  136/89 (!) 144/88 123/83  Pulse: (!) 105 (!) 101 95 (!) 101  Resp:      Temp:      TempSrc:      SpO2: (!) 88% 96% 97% 95%  PainSc:        Isolation Precautions No active isolations  Medications Medications  escitalopram (LEXAPRO) tablet 10 mg (has no administration in time range)  fluticasone furoate-vilanterol (BREO ELLIPTA) 100-25 MCG/INH 1 puff (1 puff Inhalation Not Given 09/01/19 0002)    And  umeclidinium bromide (INCRUSE ELLIPTA) 62.5 MCG/INH 1 puff (1 puff Inhalation Not Given 09/01/19 0002)  enoxaparin (LOVENOX) injection 40 mg (has no administration in time range)  0.9 %  sodium chloride infusion (has no administration in time range)  acetaminophen (TYLENOL) tablet 650 mg (has no administration in time range)    Or  acetaminophen (TYLENOL) suppository 650 mg (has no administration in time range)  cefTRIAXone (ROCEPHIN) 1 g in sodium chloride 0.9 % 100 mL IVPB (has no administration in time range)  azithromycin (ZITHROMAX) 500 mg  in sodium chloride 0.9 % 250 mL IVPB (has no administration in time range)  ondansetron (ZOFRAN) tablet 4 mg (has no administration in time range)    Or  ondansetron (ZOFRAN) injection 4 mg (has no administration in time range)  sodium chloride 0.9 % bolus 500 mL (0 mLs Intravenous Stopped 08/31/19 1954)  ziprasidone (GEODON) injection 10 mg (10 mg Intramuscular Given 08/31/19 2107)  sterile water (preservative free) injection (  Given 08/31/19 2122)  cefTRIAXone (ROCEPHIN) 1 g in sodium chloride 0.9 % 100 mL IVPB (0 g Intravenous Stopped 09/01/19 0256)  azithromycin (ZITHROMAX) 500 mg in sodium chloride 0.9 % 250 mL IVPB (0 mg Intravenous Stopped 09/01/19 0357)    Mobility walks High fall risk   Focused Assessments    R Recommendations: See  Admitting Provider Note  Report given to:   Additional Notes: Pt is confused and on 3lpm via nasal cannula. Pt is also IVC.

## 2019-09-01 NOTE — ED Provider Notes (Signed)
COVID-19 screen is negative.  BnP is negative, therefore CHF is unlikely.  Patient now has an oxygen requirement as she has become hypoxic to the upper 80s.  She has abnormal x-ray findings consistent with pneumonia It will be difficult to have her medically cleared for psychiatry.  Therefore should be admitted to the medical service for IV antibiotics.  She will likely still need to undergo psychiatric evaluation due to potential mania. Patient is overall stabilized at this time.  I discussed with Dr. Darrick Meigs with the hospitalist for admission  CRITICAL CARE Performed by: Sharyon Cable Total critical care time: 35 minutes Critical care time was exclusive of separately billable procedures and treating other patients. Critical care was necessary to treat or prevent imminent or life-threatening deterioration. Critical care was time spent personally by me on the following activities: development of treatment plan with patient and/or surrogate as well as nursing, discussions with consultants, evaluation of patient's response to treatment, examination of patient, obtaining history from patient or surrogate, ordering and performing treatments and interventions, ordering and review of laboratory studies, ordering and review of radiographic studies, pulse oximetry and re-evaluation of patient's condition. Patient with pneumonia and hypoxia requiring IV antibiotics, supplemental oxygen and admission to the hospital   Georganna Skeans was evaluated in Emergency Department on 09/01/2019 for the symptoms described in the history of present illness. She was evaluated in the context of the global COVID-19 pandemic, which necessitated consideration that the patient might be at risk for infection with the SARS-CoV-2 virus that causes COVID-19. Institutional protocols and algorithms that pertain to the evaluation of patients at risk for COVID-19 are in a state of rapid change based on information released by  regulatory bodies including the CDC and federal and state organizations. These policies and algorithms were followed during the patient's care in the ED.    Ripley Fraise, MD 09/01/19 Natasha Mead

## 2019-09-02 ENCOUNTER — Inpatient Hospital Stay (HOSPITAL_COMMUNITY): Payer: Medicaid Other

## 2019-09-02 DIAGNOSIS — J9811 Atelectasis: Secondary | ICD-10-CM

## 2019-09-02 DIAGNOSIS — F309 Manic episode, unspecified: Secondary | ICD-10-CM

## 2019-09-02 MED ORDER — FLUOXETINE HCL 10 MG PO CAPS
10.0000 mg | ORAL_CAPSULE | Freq: Every day | ORAL | Status: DC
  Administered 2019-09-02 – 2019-09-03 (×2): 10 mg via ORAL
  Filled 2019-09-02 (×3): qty 1

## 2019-09-02 MED ORDER — OLANZAPINE 5 MG PO TABS
2.5000 mg | ORAL_TABLET | Freq: Every day | ORAL | Status: DC
  Administered 2019-09-02: 2.5 mg via ORAL
  Filled 2019-09-02 (×2): qty 1

## 2019-09-02 MED ORDER — OLANZAPINE 5 MG PO TBDP
5.0000 mg | ORAL_TABLET | Freq: Once | ORAL | Status: AC
  Administered 2019-09-02: 5 mg via ORAL
  Filled 2019-09-02: qty 1

## 2019-09-02 MED ORDER — DOXYCYCLINE HYCLATE 100 MG PO TABS
100.0000 mg | ORAL_TABLET | Freq: Two times a day (BID) | ORAL | Status: DC
  Administered 2019-09-02 – 2019-09-03 (×3): 100 mg via ORAL
  Filled 2019-09-02 (×4): qty 1

## 2019-09-02 NOTE — Consult Note (Signed)
Telepsych Consultation   Reason for Consult:  mania Referring Physician:  Dr. Reatha Harps Location of Patient: [facility] Location of Provider: [place] Department  Patient Identification: Rebecca Liu MRN:  [id number] Principal Diagnosis: CAP (community acquired pneumonia) Diagnosis:  Principal Problem:   CAP (community acquired pneumonia) Active Problems:   Acute Mania   Total Time spent with patient: 30 minutes  Subjective:   Rebecca Liu is a 53 y.o. female patient admitted with mania. Patient unable to participate fully in interview, however did give permission to speak with husband who was at the bedside. He reports patient began to become confused on Monday after returning to go shopping with her mother. Patient states 'Rebecca Liu is confused right now. " when assessing her reason for coming to the hospital. As per husband " 10 days ago she started throwing up and has not taken her medication since. She is prescribed xanax 0.5mg  po daily and Effexor. Due to her being sick she has not taken either of the medications. She has never acted this way before.   HPI:  Rebecca Liu is an 53 y.o. female presenting under IVC for mania. When asked why are you here, patient stated "because I had loss of memory and I was talking to myself". Per triage note, patient boyfriend reported patient was admitted here recently for low sodium and potassium. Boyfriend stated "she has lost her memory since she left here and we took her to her doctor today and he said to get to the hospital right away". Patient was confused, hollering and aggressive in triage. Patient denied receiving any mental health outpatient services at this time. Patient denied prior suicide attempts and self-harming behaviors. Patient currently resides alone and has a boyfriend Rebecca Liu)  that sometimes lives with her, whom is her main support. Patient reported normal sleep and appetite. Patient denied SI, HI, psychosis  and alcohol/drug usage.  During the evaluation she was alert and oriented x three, intermittent periods of confusion and tangentiality. She is pleasantly engaged and cooperative, however her speech is broken and pressured. She denies SI/HI/avH.    Past Psychiatric History: Depression. Does not see a psychiatrist however sees PCP for medications Ferdinand Cava, MD. She was previously taking Lexapro 20mg  for up to 10 years. THe medication stopped working for her, and then was prescribed Effexor 75mg  po daily, which was increased to Effexor 150mg  po daily. This medication was decreased due to complications which patient nor husband are able to remember. They did state the medication was too strong.  No previous suicide attempts. Reported suicidal ideations when previously on Wellbutrin. Denies any previous mania or psychosis.   Risk to Self: Suicidal Ideation: No Suicidal Intent: No Is patient at risk for suicide?: No Suicidal Plan?: No Access to Means: No What has been your use of drugs/alcohol within the last 12 months?: (none reported) How many times?: (0) Other Self Harm Risks: (none reported) Triggers for Past Attempts: (n/a) Intentional Self Injurious Behavior: None Risk to Others: Homicidal Ideation: No Thoughts of Harm to Others: No Current Homicidal Intent: No Current Homicidal Plan: No Access to Homicidal Means: No Identified Victim: (n/a) History of harm to others?: No Assessment of Violence: None Noted Violent Behavior Description: (none reported) Does patient have access to weapons?: No Criminal Charges Pending?: No Does patient have a court date: No Prior Inpatient Therapy: Prior Inpatient Therapy: No Prior Outpatient Therapy: Prior Outpatient Therapy: No Does patient have an ACCT team?: No Does patient have Intensive In-House  Services?  : No Does patient have [facility] services? : No Does patient have P4CC services?: No  Past Medical History:  Past Medical History:   Diagnosis Date  . COPD (chronic obstructive pulmonary disease) (HCC)   . Depression    History reviewed. No pertinent surgical history. Family History: History reviewed. No pertinent family history. Family Psychiatric  History: Denies Social History:  Social History   Substance and Sexual Activity  Alcohol Use No     Social History   Substance and Sexual Activity  Drug Use No    Social History   Socioeconomic History  . Marital status: Single    Spouse name: Not on file  . Number of children: Not on file  . Years of education: Not on file  . Highest education level: Not on file  Occupational History  . Not on file  Social Needs  . Financial resource strain: Not on file  . Food insecurity    Worry: Not on file    Inability: Not on file  . Transportation needs    Medical: Not on file    Non-medical: Not on file  Tobacco Use  . Smoking status: Current Every Day Smoker  . Smokeless tobacco: Never Used  Substance and Sexual Activity  . Alcohol use: No  . Drug use: No  . Sexual activity: Not on file  Lifestyle  . Physical activity    Days per week: Not on file    Minutes per session: Not on file  . Stress: Not on file  Relationships  . Social Musician on phone: Not on file    Gets together: Not on file    Attends religious service: Not on file    Active member of club or organization: Not on file    Attends meetings of clubs or organizations: Not on file    Relationship status: Not on file  Other Topics Concern  . Not on file  Social History Narrative  . Not on file   Additional Social History:    Allergies:   Allergies  Allergen Reactions  . Wellbutrin [Bupropion] Other (See Comments)    Reaction is not known per spouse    Labs:  Results for orders placed or performed during the hospital encounter of 08/31/19 (from the past 48 hour(s))  Urinalysis, Routine w reflex microscopic     Status: Abnormal   Collection Time: 08/31/19  5:37 PM   Result Value Ref Range   Color, Urine COLORLESS (A) YELLOW   APPearance CLEAR CLEAR   Specific Gravity, Urine 1.002 (L) 1.005 - 1.030   pH 7.0 5.0 - 8.0   Glucose, UA NEGATIVE NEGATIVE mg/dL   Hgb urine dipstick NEGATIVE NEGATIVE   Bilirubin Urine NEGATIVE NEGATIVE   Ketones, ur NEGATIVE NEGATIVE mg/dL   Protein, ur NEGATIVE NEGATIVE mg/dL   Nitrite NEGATIVE NEGATIVE   Leukocytes,Ua NEGATIVE NEGATIVE    Comment: Performed at [facility], [address], [place], [place] [address]  CBC with Differential/Platelet     Status: None   Collection Time: 08/31/19  5:55 PM  Result Value Ref Range   WBC 9.5 4.0 - 10.5 K/uL   RBC 4.08 3.87 - 5.11 MIL/uL   Hemoglobin 13.4 12.0 - 15.0 g/dL   HCT 60.4 54.0 - 98.1 %   MCV 98.8 80.0 - 100.0 fL   MCH 32.8 26.0 - 34.0 pg   MCHC 33.3 30.0 - 36.0 g/dL   RDW 19.1 47.8 - 29.5 %  Platelets 280 150 - 400 K/uL   nRBC 0.0 0.0 - 0.2 %   Neutrophils Relative % 78 %   Neutro Abs 7.5 1.7 - 7.7 K/uL   Lymphocytes Relative 16 %   Lymphs Abs 1.5 0.7 - 4.0 K/uL   Monocytes Relative 4 %   Monocytes Absolute 0.4 0.1 - 1.0 K/uL   Eosinophils Relative 1 %   Eosinophils Absolute 0.1 0.0 - 0.5 K/uL   Basophils Relative 1 %   Basophils Absolute 0.1 0.0 - 0.1 K/uL   Immature Granulocytes 0 %   Abs Immature Granulocytes 0.03 0.00 - 0.07 K/uL    Comment: Performed at [facility], [address] [place], [place] [address]  Comprehensive metabolic panel     Status: Abnormal   Collection Time: 08/31/19  5:55 PM  Result Value Ref Range   Sodium 137 135 - 145 mmol/L   Potassium 3.6 3.5 - 5.1 mmol/L   Chloride 106 98 - 111 mmol/L   CO2 23 22 - 32 mmol/L   Glucose, Bld 97 70 - 99 mg/dL   BUN 8 6 - 20 mg/dL   Creatinine, Ser 6.040.73 0.44 - 1.00 mg/dL   Calcium 8.3 (L) 8.9 - 10.3 mg/dL   Total Protein 6.8 6.5 - 8.1 g/dL   Albumin 3.7 3.5 - 5.0 g/dL   AST 7 (L) 15 - 41 U/L   ALT 11 0 - 44 U/L   Alkaline Phosphatase 80 38 - 126 U/L   Total Bilirubin 0.3 0.3 - 1.2  mg/dL   GFR calc non Af Amer >60 >60 mL/min   GFR calc Af Amer >60 >60 mL/min   Anion gap 8 5 - 15    Comment: Performed at [facility], [address] [place], [place] [address]  Ethanol     Status: None   Collection Time: 08/31/19  5:55 PM  Result Value Ref Range   Alcohol, Ethyl (B) <10 <10 mg/dL    Comment: (NOTE) Lowest detectable limit for serum alcohol is 10 mg/dL. For medical purposes only. Performed at [facility], [address] [place], [place] [address]   Brain natriuretic peptide     Status: None   Collection Time: 08/31/19  5:56 PM  Result Value Ref Range   B Natriuretic Peptide 31.0 0.0 - 100.0 pg/mL    Comment: Performed at [facility], [address] [place], [place] [address]  Rapid urine drug screen (hospital performed)     Status: None   Collection Time: 08/31/19  6:24 PM  Result Value Ref Range   Opiates NONE DETECTED NONE DETECTED   Cocaine NONE DETECTED NONE DETECTED   Benzodiazepines NONE DETECTED NONE DETECTED   Amphetamines NONE DETECTED NONE DETECTED   Tetrahydrocannabinol NONE DETECTED NONE DETECTED   Barbiturates NONE DETECTED NONE DETECTED    Comment: (NOTE) DRUG SCREEN FOR MEDICAL PURPOSES ONLY.  IF CONFIRMATION IS NEEDED FOR ANY PURPOSE, NOTIFY LAB WITHIN 5 DAYS. LOWEST DETECTABLE LIMITS FOR URINE DRUG SCREEN Drug Class                     Cutoff (ng/mL) Amphetamine and metabolites    1000 Barbiturate and metabolites    200 Benzodiazepine                 200 Tricyclics and metabolites     300 Opiates and metabolites        300 Cocaine and metabolites        300 THC  50 Performed at [facility], [address] [place], [place] [address]   Pregnancy, urine     Status: None   Collection Time: 08/31/19 11:15 PM  Result Value Ref Range   Preg Test, Ur NEGATIVE NEGATIVE    Comment:        THE SENSITIVITY OF THIS METHODOLOGY IS >20 mIU/mL. Performed at [facility], [address] [place], [place] [address]    SARS Coronavirus 2 by RT PCR (hospital order, performed in [facility] hospital lab) Nasopharyngeal Nasopharyngeal Swab     Status: None   Collection Time: 09/01/19 12:05 AM   Specimen: Nasopharyngeal Swab  Result Value Ref Range   SARS Coronavirus 2 NEGATIVE NEGATIVE    Comment: (NOTE) If result is NEGATIVE SARS-CoV-2 target nucleic acids are NOT DETECTED. The SARS-CoV-2 RNA is generally detectable in upper and lower  respiratory specimens during the acute phase of infection. The lowest  concentration of SARS-CoV-2 viral copies this assay can detect is 250  copies / mL. A negative result does not preclude SARS-CoV-2 infection  and should not be used as the sole basis for treatment or other  patient management decisions.  A negative result may occur with  improper specimen collection / handling, submission of specimen other  than nasopharyngeal swab, presence of viral mutation(s) within the  areas targeted by this assay, and inadequate number of viral copies  (<250 copies / mL). A negative result must be combined with clinical  observations, patient history, and epidemiological information. If result is POSITIVE SARS-CoV-2 target nucleic acids are DETECTED. The SARS-CoV-2 RNA is generally detectable in upper and lower  respiratory specimens dur ing the acute phase of infection.  Positive  results are indicative of active infection with SARS-CoV-2.  Clinical  correlation with patient history and other diagnostic information is  necessary to determine patient infection status.  Positive results do  not rule out bacterial infection or co-infection with other viruses. If result is PRESUMPTIVE POSTIVE SARS-CoV-2 nucleic acids MAY BE PRESENT.   A presumptive positive result was obtained on the submitted specimen  and confirmed on repeat testing.  While 2019 novel coronavirus  (SARS-CoV-2) nucleic acids may be present in the submitted sample  additional confirmatory testing may be  necessary for epidemiological  and / or clinical management purposes  to differentiate between  SARS-CoV-2 and other Sarbecovirus currently known to infect humans.  If clinically indicated additional testing with an alternate test  methodology [id number]) is advised. The SARS-CoV-2 RNA is generally  detectable in upper and lower respiratory sp ecimens during the acute  phase of infection. The expected result is Negative. Fact Sheet for Patients:  BoilerBrush.com.cy Fact Sheet for Healthcare Providers: https://pope.com/ This test is not yet approved or cleared by the [place] FDA and has been authorized for detection and/or diagnosis of SARS-CoV-2 by FDA under an Emergency Use Authorization (EUA).  This EUA will remain in effect (meaning this test can be used) for the duration of the COVID-19 declaration under Section 564(b)(1) of the Act, 21 U.S.C. section 360bbb-3(b)(1), unless the authorization is terminated or revoked sooner. Performed at [facility], [address] [place], [place] [address]   Basic metabolic panel     Status: Abnormal   Collection Time: 09/01/19  8:38 AM  Result Value Ref Range   Sodium 145 135 - 145 mmol/L    Comment: DELTA CHECK NOTED   Potassium 3.8 3.5 - 5.1 mmol/L   Chloride 110 98 - 111 mmol/L  CO2 26 22 - 32 mmol/L   Glucose, Bld 94 70 - 99 mg/dL   BUN 8 6 - 20 mg/dL   Creatinine, Ser 1.19 0.44 - 1.00 mg/dL   Calcium 8.7 (L) 8.9 - 10.3 mg/dL   GFR calc non Af Amer >60 >60 mL/min   GFR calc Af Amer >60 >60 mL/min   Anion gap 9 5 - 15    Comment: Performed at [facility], [address], [place], [place] [address]    Medications:  Current Facility-Administered Medications  Medication Dose Route Frequency Provider Last Rate Last Dose  . 0.9 %  sodium chloride infusion   Intravenous Continuous Laural Benes, Clanford L, MD 10 mL/hr at 09/02/19 0528    . acetaminophen (TYLENOL) tablet 650 mg  650 mg Oral  Q6H PRN Meredeth Ide, MD   650 mg at 09/01/19 1537   Or  . acetaminophen (TYLENOL) suppository 650 mg  650 mg Rectal Q6H PRN Meredeth Ide, MD      . doxycycline (VIBRA-TABS) tablet 100 mg  100 mg Oral Q12H Johnson, Clanford L, MD   100 mg at 09/02/19 1040  . enoxaparin (LOVENOX) injection 40 mg  40 mg Subcutaneous Q24H Meredeth Ide, MD   40 mg at 09/02/19 1042  . FLUoxetine (PROZAC) capsule 10 mg  10 mg Oral Daily Maryagnes Amos, FNP      . fluticasone furoate-vilanterol (BREO ELLIPTA) 100-25 MCG/INH 1 puff  1 puff Inhalation Daily Meredeth Ide, MD   1 puff at 09/02/19 1210   And  . umeclidinium bromide (INCRUSE ELLIPTA) 62.5 MCG/INH 1 puff  1 puff Inhalation Daily Meredeth Ide, MD   1 puff at 09/02/19 1210  . haloperidol lactate (HALDOL) injection 5 mg  5 mg Intravenous Q6H PRN Laural Benes, Clanford L, MD   5 mg at 09/01/19 2146  . labetalol (NORMODYNE) injection 10 mg  10 mg Intravenous Q2H PRN Johnson, Clanford L, MD   10 mg at 09/01/19 1550  . OLANZapine (ZYPREXA) tablet 2.5 mg  2.5 mg Oral QHS Starkes-Perry, Juel Burrow, FNP      . OLANZapine zydis (ZYPREXA) disintegrating tablet 5 mg  5 mg Oral Once Starkes-Perry, Tyjai Charbonnet S, FNP      . ondansetron (ZOFRAN) tablet 4 mg  4 mg Oral Q6H PRN Meredeth Ide, MD       Or  . ondansetron (ZOFRAN) injection 4 mg  4 mg Intravenous Q6H PRN Meredeth Ide, MD        Musculoskeletal: Strength & Muscle Tone: within normal limits Gait & Station: normal Patient leans: N/A  Psychiatric Specialty Exam: Physical Exam  ROS  Blood pressure (!) 156/94, pulse 91, temperature 98 F (36.7 C), temperature source Oral, resp. rate 19, SpO2 95 %.There is no height or weight on file to calculate BMI.  General Appearance: Disheveled  Eye Contact:  Minimal  Speech:  Pressured  Volume:  Normal  Mood:  Anxious, Euphoric and Irritable  Affect:  Blunt and Full Range  Thought Process:  Irrelevant, Linear and Descriptions of Associations: Loose  Orientation:   Full (Time, Place, and Person)  Thought Content:  Logical and Tangential  Suicidal Thoughts:  No  Homicidal Thoughts:  No  Memory:  Immediate;   Fair Recent;   Fair  Judgement:  Impaired  Insight:  Shallow  Psychomotor Activity:  Increased and Restlessness  Concentration:  Concentration: Poor and Attention Span: Poor  Recall:  Poor  Fund of Knowledge:  Fair  Language:  Fair  Akathisia:  Negative  Handed:  Right  AIMS (if indicated):     Assets:  Communication Skills Desire for Improvement Financial Resources/Insurance Housing Leisure Time Physical Health Social Support  ADL's:  Impaired  Cognition:  WNL  Sleep:        Treatment Plan Summary: Daily contact with patient to assess and evaluate symptoms and progress in treatment, Medication management and Plan Recommend inpatient. Patient will benefit from short term use of antipsychotic for resolution of mania. mania at this time is unclear and could be due to ongoing electrolyte abnormality, discontinuation syndrome, serotonin syndrome, and or SNRI use. patient reports allergy to Wellbutin (NDRI) which made her confused and suicidal. Will start one time dose of zydis 5mg  po in a songle dose. Will start olanzapine 2.5mg  po qhs. Will start prozac 10mg  po daily for depression, patient and husband reports taking Lexapro for 10 years with improvement in symptoms up until recently. They both report lexapro was no longer working and was switched to Effexor. Will discontinue lexapro and start prozac at this time. 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 baseline labs have been reviewed and assessed. W  Disposition: Recommend psychiatric Inpatient admission when medically cleared.  This service was provided via telemedicine using a 2-way, interactive audio and video technology.  Names of all persons participating in this telemedicine service and their role in this encounter. Name: Role:  FNP-BC  Name: Role: Patient  Name: Nurse Role: Patient Nurse  Name: Mr. Un Role: Patient Husband    [name], FNP 09/02/2019 2:29 PM

## 2019-09-02 NOTE — TOC Initial Note (Signed)
Transition of Care [facility]) - Initial/Assessment Note    Patient Details  Name: Rebecca Liu MRN: [id number] Date of Birth: [birth year]  [facility] CM/SW Contact:    Woodley Petzold, Chauncey Reading, RN Phone Number: 09/02/2019, 10:19 AM  Clinical Narrative:       Patient her under IVC. Initially assessed and plan was to reassess next day. Patient admitted to medical unit for CAP.  She will need another TTS consult. Discussed with attending, patient is too lethargic today to have TTS. CM consulted for home health needs. TOC will follow and address needs as appropriate.                      Activities of Daily Living Home Assistive Devices/Equipment: None ADL Screening (condition at time of admission) Patient's cognitive ability adequate to safely complete daily activities?: Yes Is the patient deaf or have difficulty hearing?: No Does the patient have difficulty seeing, even when wearing glasses/contacts?: No Does the patient have difficulty concentrating, remembering, or making decisions?: Yes Patient able to express need for assistance with ADLs?: Yes Does the patient have difficulty dressing or bathing?: No Independently performs ADLs?: Yes (appropriate for developmental age) Communication: Independent Dressing (OT): Independent Is this a change from baseline?: Pre-admission baseline Grooming: Independent Is this a change from baseline?: Pre-admission baseline Feeding: Independent Is this a change from baseline?: Pre-admission baseline Bathing: Independent Is this a change from baseline?: Pre-admission baseline Is this a change from baseline?: Pre-admission baseline In/Out Bed: Independent Is this a change from baseline?: Pre-admission baseline Is this a change from baseline?: Pre-admission baseline Does the patient have difficulty walking or climbing stairs?: No Weakness of Legs: None Weakness of Arms/Hands: None             Admission diagnosis:  Cough  [R05] Delirium [R41.0] Pneumonia [J18.9] Hypoxia [R09.02] Community acquired pneumonia, unspecified laterality [J18.9] Patient Active Problem List   Diagnosis Date Noted  . CAP (community acquired pneumonia) 09/01/2019  . Acute Mania 09/01/2019  . Hyponatremia 08/21/2019  . Essential hypertension 08/21/2019  . High risk medication use 08/21/2019  . Chronic headache disorder 08/21/2019  . Hypomagnesemia 08/21/2019  . Depression   . COPD (chronic obstructive pulmonary disease) ([place])   . Hypokalemia   . Nausea and vomiting   . Current smoker   . Leukocytosis   . GAD (generalized anxiety disorder)    PCP:  Alfonso Ellis, NP Pharmacy:   [id number] - [place], [place] - [address] [place] [address] Phone: [phone] Fax: [phone]     Social Determinants of Health (SDOH) Interventions    Readmission Risk Interventions No flowsheet data found.

## 2019-09-02 NOTE — Progress Notes (Signed)
PROGRESS NOTE    Rebecca Liu  VOJ:[id number]  DOB: 1966-07-23  DOA: 08/31/2019 PCP: Ardell Isaacs, NP   Brief Admission Hx: 53 year old female presented with an acute manic episode.  She has a history of COPD and hypertension.  She was admitted for medical clearance as she was noted to have a possible pneumonia.  MDM/Assessment & Plan:   1. Possible pneumonia- I am not convinced that she actually has a pneumonia at this time with a normal white blood cell count, she is afebrile and not requiring oxygen.  She has been rapidly de-escalated to doxycycline and should finish the course tomorrow of oral doxy.  Her chest x-ray suggesting atelectasis and I have added incentive spirometry.  She is medically cleared at this point for advanced psychiatric evaluation and treatment which I believe she requires. 2. Acute manic episode-patient remains in acute mania.  She is being medicated with Haldol as needed.  I have ordered the start Seroquel 25 mg twice daily to help control symptoms.  I have ordered for TTS evaluation as I believe she likely is going to require some behavioral health inpatient treatments. 3. Essential hypertension- IV blood pressure medication ordered as needed.  Start amlodipine 5 mg daily. 4. COPD-stable on home bronchodilators. 5. 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  DVT prophylaxis: Lovenox Code Status: Full Family Communication: Attempted to contact husband no answer Disposition Plan: TTS evaluation requested   Consultants:  TTS behavioral health  Procedures:    Antimicrobials:  Doxycycline  Subjective: Patient remains in a manic state she is speaking constantly in a pressured manner and loose disorganized thoughts remain unchanged  Objective: Vitals:   09/01/19 1619 09/01/19 2007 09/01/19 2120 09/02/19 0534  BP: (!) 145/95  (!) 183/99 (!) 156/94   Pulse: 77  94 91  Resp:   19   Temp:   98 F (36.7 C) 98 F (36.7 C)  TempSrc:   Oral Oral  SpO2: 96% 96% 94% 96%    Intake/Output Summary (Last 24 hours) at 09/02/2019 1111 Last data filed at 09/02/2019 0900 Gross per 24 hour  Intake 120 ml  Output --  Net 120 ml   There were no vitals filed for this visit.   REVIEW OF SYSTEMS  As per history otherwise all reviewed and reported negative  Exam:  General exam: Awake, no apparent distress with acute mania. Respiratory system: Clear. No increased work of breathing. Cardiovascular system: S1 & S2 heard. No JVD, murmurs, gallops, clicks or pedal edema. Gastrointestinal system: Abdomen is nondistended, soft and nontender. Normal bowel sounds heard. Central nervous system: Alert and oriented. No focal neurological deficits. Extremities: no CCE.  Data Reviewed: Basic Metabolic Panel: Recent Labs  Lab 08/31/19 1755 09/01/19 0838  NA 137 145  K 3.6 3.8  CL 106 110  CO2 23 26  GLUCOSE 97 94  BUN 8 8  CREATININE 0.73 0.64  CALCIUM 8.3* 8.7*   Liver Function Tests: Recent Labs  Lab 08/31/19 1755  AST 7*  ALT 11  ALKPHOS 80  BILITOT 0.3  PROT 6.8  ALBUMIN 3.7   No results for input(s): LIPASE, AMYLASE in the last 168 hours. No results for input(s): AMMONIA in the last 168 hours. CBC: Recent Labs  Lab 08/31/19 1755  WBC 9.5  NEUTROABS 7.5  HGB 13.4  HCT 40.3  MCV 98.8  PLT 280   Cardiac Enzymes: No  results for input(s): CKTOTAL, CKMB, CKMBINDEX, TROPONINI in the last 168 hours. CBG (last 3)  No results for input(s): GLUCAP in the last 72 hours. Recent Results (from the past 240 hour(s))  SARS Coronavirus 2 by RT PCR (hospital order, performed in [facility] hospital lab) Nasopharyngeal Nasopharyngeal Swab     Status: None   Collection Time: 09/01/19 12:05 AM   Specimen: Nasopharyngeal Swab  Result Value Ref Range Status   SARS Coronavirus 2 NEGATIVE NEGATIVE Final    Comment: (NOTE) If result is  NEGATIVE SARS-CoV-2 target nucleic acids are NOT DETECTED. The SARS-CoV-2 RNA is generally detectable in upper and lower  respiratory specimens during the acute phase of infection. The lowest  concentration of SARS-CoV-2 viral copies this assay can detect is 250  copies / mL. A negative result does not preclude SARS-CoV-2 infection  and should not be used as the sole basis for treatment or other  patient management decisions.  A negative result may occur with  improper specimen collection / handling, submission of specimen other  than nasopharyngeal swab, presence of viral mutation(s) within the  areas targeted by this assay, and inadequate number of viral copies  (<250 copies / mL). A negative result must be combined with clinical  observations, patient history, and epidemiological information. If result is POSITIVE SARS-CoV-2 target nucleic acids are DETECTED. The SARS-CoV-2 RNA is generally detectable in upper and lower  respiratory specimens dur ing the acute phase of infection.  Positive  results are indicative of active infection with SARS-CoV-2.  Clinical  correlation with patient history and other diagnostic information is  necessary to determine patient infection status.  Positive results do  not rule out bacterial infection or co-infection with other viruses. If result is PRESUMPTIVE POSTIVE SARS-CoV-2 nucleic acids MAY BE PRESENT.   A presumptive positive result was obtained on the submitted specimen  and confirmed on repeat testing.  While 2019 novel coronavirus  (SARS-CoV-2) nucleic acids may be present in the submitted sample  additional confirmatory testing may be necessary for epidemiological  and / or clinical management purposes  to differentiate between  SARS-CoV-2 and other Sarbecovirus currently known to infect humans.  If clinically indicated additional testing with an alternate test  methodology [phone]) is advised. The SARS-CoV-2 RNA is generally  detectable  in upper and lower respiratory sp ecimens during the acute  phase of infection. The expected result is Negative. Fact Sheet for Patients:  StrictlyIdeas.no Fact Sheet for Healthcare Providers: BankingDealers.co.za This test is not yet approved or cleared by the [place] FDA and has been authorized for detection and/or diagnosis of SARS-CoV-2 by FDA under an Emergency Use Authorization (EUA).  This EUA will remain in effect (meaning this test can be used) for the duration of the COVID-19 declaration under Section 564(b)(1) of the Act, 21 U.S.C. section 360bbb-3(b)(1), unless the authorization is terminated or revoked sooner. Performed at [facility], [place], [address], [place],  [address]      Studies: Ct Head Wo Contrast  Result Date: 08/31/2019 CLINICAL DATA:  Altered level of consciousness. EXAM: CT HEAD WITHOUT CONTRAST TECHNIQUE: Contiguous axial images were obtained from the base of the skull through the vertex without intravenous contrast. COMPARISON:  CT head without contrast 08/21/2019 FINDINGS: Brain: No acute infarct, hemorrhage, or mass lesion is present. No significant white matter lesions are present. The ventricles are of normal size. No significant extraaxial fluid collection is present. Vascular: No hyperdense vessel or unexpected calcification. Skull: Calvarium is intact. No  focal lytic or blastic lesions are present. Sinuses/Orbits: The paranasal sinuses and mastoid air cells are clear. The globes and orbits are within normal limits. IMPRESSION: Normal CT of the head. Electronically Signed   By: Marin Robertshristopher  Mattern M.D.   On: 08/31/2019 18:51   Dg Chest Port 1 View  Result Date: 09/02/2019 CLINICAL DATA:  Community acquired pneumonia. EXAM: PORTABLE CHEST 1 VIEW COMPARISON:  Radiograph 09/01/2019, CT 11/23/2011 FINDINGS: Coarse interstitial and chronic emphysematous changes are present with stable biapical  pleuroparenchymal scarring. Focal opacity is again seen in the left lung base. No pneumothorax or visible effusion. The cardiomediastinal contours are unremarkable. Degenerative changes are present in the and imaged spine and shoulders. IMPRESSION: 1. Focal opacity in the left lung base is suspicious for or atelectasis. 2. Coarse interstitial and chronic emphysematous changes. Electronically Signed   By: Kreg ShropshirePrice  DeHay M.D.   On: 09/02/2019 04:31   Dg Chest Port 1 View  Result Date: 09/01/2019 CLINICAL DATA:  Cough, COPD. EXAM: PORTABLE CHEST 1 VIEW COMPARISON:  08/31/2019 FINDINGS: Developing opacity at the left lung base since prior study. Mild interstitial prominence. With accentuated minor fissure in the right chest, not present the previous study. Cardiomediastinal contours are normal. No acute bone finding. IMPRESSION: Shifting opacities in the chest now with left lower lobe opacity, consider shifting atelectasis or developing left lower lobe consolidation. Interstitial prominence may reflect interstitial edema scarring is noted along the minor fissure in the right chest. Electronically Signed   By: Donzetta KohutGeoffrey  Wile M.D.   On: 09/01/2019 08:17   Dg Chest Portable 1 View  Result Date: 08/31/2019 CLINICAL DATA:  Weakness, confusing combative EXAM: PORTABLE CHEST 1 VIEW COMPARISON:  Radiograph 08/21/2019 FINDINGS: Diffuse hazy interstitial opacity with basilar predominance. Vascular cephalization is noted. Mild more consolidative opacity is noted in the periphery of the right lung base. Cardiomediastinal contours are unremarkable for the portable technique. Low positioning of the left humeral head relative to the glenoid, possibly positional. No other acute osseous or soft tissue abnormality. IMPRESSION: 1. Diffuse hazy interstitial opacity with basilar predominance, consistent with interstitial edema. 2. More consolidative opacity in the periphery of the right lung base, could reflect alveolar edema or  infection. 3. Low positioning of the left humeral head relative to the glenoid, correlate with shoulder symptoms. Consider dedicated radiographs. Electronically Signed   By: Kreg ShropshirePrice  DeHay M.D.   On: 08/31/2019 23:47     Scheduled Meds:  doxycycline  100 mg Oral Q12H   enoxaparin (LOVENOX) injection  40 mg Subcutaneous Q24H   escitalopram  10 mg Oral Daily   fluticasone furoate-vilanterol  1 puff Inhalation Daily   And   umeclidinium bromide  1 puff Inhalation Daily   QUEtiapine  25 mg Oral BID   Continuous Infusions:  sodium chloride 10 mL/hr at 09/02/19 [id number]    Principal Problem:   CAP (community acquired pneumonia) Active Problems:   Acute Mania  Time spent:   Standley Dakinslanford Kiyani Jernigan, MD [facility] 09/02/2019, 11:11 AM    LOS: 1 day  How to contact the [facility] Attending or Consulting provider 7A - 7P or covering provider during after hours 7P -7A, for this patient?  1. Check the care team in [facility] and look for a) attending/consulting [facility] provider listed and b) the [facility] team listed 2. Log into [email or web address] and use [facility] universal password to access. If you do not have the password, please contact the hospital operator. 3. Locate the [facility] provider you are looking for under [facility] and page to a number that you can be directly reached. 4. If you still have difficulty reaching the provider, please page the [facility] (Director on Call) for the Hospitalists listed on amion for assistance.

## 2019-09-02 NOTE — Evaluation (Addendum)
Physical Therapy Evaluation Patient Details Name: Rebecca Liu MRN: [id number] DOB: [birth year] Today's Date: 09/02/2019   History of Present Illness  Rebecca Liu  is a 53 y.o. female, with history of COPD, hypertension, depression who was recently discharged from the hospital after treatment for hyponatremia, was brought to ED by patient by friend for altered mental status.  Patient has been awake for 3 days 3 days and has been confused and combative.    Clinical Impression  Patient functioning at baseline for functional mobility and gait. Patient performed bed mobility, transfers, and gait independently without use of AD. She ambulates with minimally reduced cadence but is safe. Patient discharged to care of nursing for ambulation daily as tolerated for length of stay.     Follow Up Recommendations No PT follow up;Supervision - Intermittent    Equipment Recommendations  None recommended by PT    Recommendations for Other Services       Precautions / Restrictions Precautions Precautions: Fall Restrictions Weight Bearing Restrictions: No      Mobility  Bed Mobility Overal bed mobility: Independent             General bed mobility comments: transition to seated at bedside  Transfers Overall transfer level: Independent Equipment used: None             General transfer comment: transfer sit to stand/stand to sit without difficulty  Ambulation/Gait Ambulation/Gait assistance: Modified independent (Device/Increase time) Gait Distance (Feet): 250 Feet Assistive device: None Gait Pattern/deviations: Step-through pattern;Decreased step length - right;Decreased step length - left;Decreased stride length Gait velocity: near baseline   General Gait Details: minimally decreased cadance, reduced reciprocol arm swing  Stairs            Wheelchair Mobility    Modified Rankin (Stroke Patients Only)       Balance Overall balance assessment:  Independent                                           Pertinent Vitals/Pain Pain Assessment: No/denies pain    Home Living Family/patient expects to be discharged to:: Private residence Living Arrangements: Alone Available Help at Discharge: Family;Friend(s);Available PRN/intermittently Type of Home: Mobile home Home Access: Level entry     Home Layout: One level Home Equipment: Walker - standard;Cane - single point      Prior Function Level of Independence: Independent   Gait / Transfers Assistance Needed: pt describes community ambulator without AD  ADL's / Homemaking Assistance Needed: pt states no assistance        Hand Dominance        Extremity/Trunk Assessment   Upper Extremity Assessment Upper Extremity Assessment: Overall WFL for tasks assessed    Lower Extremity Assessment Lower Extremity Assessment: Overall WFL for tasks assessed       Communication   Communication: No difficulties  Cognition Arousal/Alertness: Awake/alert Behavior During Therapy: WFL for tasks assessed/performed Overall Cognitive Status: History of cognitive impairments - at baseline                                 General Comments: pt repeating same phrases througout session      General Comments      Exercises     Assessment/Plan    PT Assessment Patent does not need any further PT services  PT  Problem List         PT Treatment Interventions      PT Goals (Current goals can be found in the Care Plan section)  Acute Rehab PT Goals Patient Stated Goal: Return home PT Goal Formulation: With patient Time For Goal Achievement: 09/02/19 Potential to Achieve Goals: Good    Frequency     Barriers to discharge        Co-evaluation               AM-PAC PT "6 Clicks" Mobility  Outcome Measure Help needed turning from your back to your side while in a flat bed without using bedrails?: None Help needed moving from lying on  your back to sitting on the side of a flat bed without using bedrails?: None Help needed moving to and from a bed to a chair (including a wheelchair)?: None Help needed standing up from a chair using your arms (e.g., wheelchair or bedside chair)?: None Help needed to walk in hospital room?: None Help needed climbing 3-5 steps with a railing? : None 6 Click Score: 24    End of Session   Activity Tolerance: Patient tolerated treatment well Patient left: in bed;with nursing/sitter in room;with call bell/phone within reach Nurse Communication: Mobility status PT Visit Diagnosis: Unsteadiness on feet (R26.81);Muscle weakness (generalized) (M62.81);Other abnormalities of gait and mobility (R26.89)    Time: 1352-1406 PT Time Calculation (min) (ACUTE ONLY): 14 min   Charges:   PT Evaluation $PT Eval Low Complexity: 1 Low PT Treatments $Therapeutic Activity: 8-22 mins       2:25 PM, 09/02/19 Mearl Latin PT, DPT Physical Therapist at [facility]

## 2019-09-03 MED ORDER — OLANZAPINE 2.5 MG PO TABS: 2.5000 mg | ORAL_TABLET | Freq: Every day | ORAL | 0 refills | Status: AC

## 2019-09-03 MED ORDER — FLUOXETINE HCL 10 MG PO CAPS: 10.0000 mg | ORAL_CAPSULE | Freq: Every day | ORAL | 1 refills | Status: AC

## 2019-09-03 MED ORDER — DOXYCYCLINE HYCLATE 100 MG PO TABS: 100.0000 mg | ORAL_TABLET | Freq: Two times a day (BID) | ORAL | 0 refills | Status: AC

## 2019-09-03 MED ORDER — AMLODIPINE BESYLATE 5 MG PO TABS: 5.0000 mg | ORAL_TABLET | Freq: Every day | ORAL | 1 refills | Status: AC

## 2019-09-03 MED ORDER — AMLODIPINE BESYLATE 5 MG PO TABS
5.0000 mg | ORAL_TABLET | Freq: Every day | ORAL | Status: DC
  Administered 2019-09-03: 5 mg via ORAL
  Filled 2019-09-03: qty 1

## 2019-09-03 NOTE — Discharge Summary (Signed)
Physician Discharge Summary  Rebecca Liu MVH:[id number] DOB: 08-09-1966 DOA: 10/14/202[name]  PCP: Rebecca Liu  Admit date: 08/31/2019 Discharge date: 09/03/2019  Admitted From:  HOME  Disposition: HOME   Recommendations for Outpatient Follow-up:  1. Follow up with PCP in 3-7 days 2. Follow up with behavioral health/psychiatry in 1 week 3. Please obtain BMP/CBC in 1 week  Discharge Condition: Medically stable, psychiatrically cleared  CODE STATUS: FULL    Brief Hospitalization Summary: Please see all hospital notes, images, labs for full details of the hospitalization. Brief Admission Hx: 53 year old female presented with an acute manic episode.  She has a history of COPD and hypertension.  She was admitted for medical clearance as she was noted to have a possible pneumonia.  MDM/Assessment & Plan:   1. Question of pneumonia- I am not convinced that she actually has a pneumonia at this time with a normal white blood cell count, she is afebrile and not requiring oxygen.  She has been rapidly de-escalated to doxycycline and should finish the course in next 2 days.  Her chest x-ray suggesting atelectasis and I have added incentive spirometry.  She is medically cleared to transfer to behavioral health.  SHE WAS SEEN BY BEHAVIORAL HEALTH AND PSYCH CLEARED TO GO HOME WITH OUTPATIENT FOLLOW UP.  2. Acute manic episode- She was evaluated by TTS and has been started on zyprexa and prozac. Pt to take zyprexa 2.5 mg QHS x 7 days, continue prozac 10 mg daily.    3. Essential hypertension- Start amlodipine 5 mg daily.  Follow up with PCP.  4. COPD-stable on home bronchodilators. 5. [facility] on prozac 10 mg daily.   DVT prophylaxis: Lovenox Code Status: Full Family Communication: Attempted to contact husband no answer Disposition Plan: PT PSYCH CLEARED TO DISCHARGE HOME  Consultants:  TTS behavioral health  Rebecca Liu  Discharge  Diagnoses:  Principal Problem:   CAP (community acquired pneumonia) Active Problems:   Acute Mania   Discharge Instructions: Discharge Instructions    Call MD for:  difficulty breathing, headache or visual disturbances   Complete by: As directed    Call MD for:  extreme fatigue   Complete by: As directed    Call MD for:  persistant dizziness or light-headedness   Complete by: As directed    Call MD for:  persistant nausea and vomiting   Complete by: As directed    Call MD for:  severe uncontrolled pain   Complete by: As directed    Increase activity slowly   Complete by: As directed      Allergies as of 09/03/2019      Reactions   Wellbutrin [bupropion] Other (See Comments)   Reaction is not known per spouse      Medication List    STOP taking these medications   ALPRAZolam 0.5 MG tablet Commonly known as: XANAX   escitalopram 10 MG tablet Commonly known as: Lexapro     TAKE these medications   albuterol 108 (90 Base) MCG/ACT inhaler Commonly known as: VENTOLIN HFA Inhale 2 puffs into the lungs every 4 (four) hours.   amLODipine 5 MG tablet Commonly known as: NORVASC Take 1 tablet (5 mg total) by mouth daily.   doxycycline 100 MG tablet Commonly known as: VIBRA-TABS Take 1 tablet (100 mg total) by mouth every 12 (twelve) hours for 2 days.   FLUoxetine 10 MG capsule Commonly known as: PROZAC Take 1 capsule (10 mg total) by  mouth daily. Start taking on: September 04, 2019   OLANZapine 2.5 MG tablet Commonly known as: ZYPREXA Take 1 tablet (2.5 mg total) by mouth at bedtime for 7 days.   Trelegy Ellipta 100-62.5-25 MCG/INH Aepb Generic drug: Fluticasone-Umeclidin-Vilant Take 1 puff by mouth daily.      Follow-up Information    Rebecca Isaacs, Liu. Schedule an appointment as soon as possible for a visit in 3 day(s).   Specialty: Nurse Practitioner Contact information: [address] [place] [phone]        Rebecca Amos, Liu.  Schedule an appointment as soon as possible for a visit in 1 week(s).   Specialties: Nurse Practitioner, Psychiatry Why: Hospital Follow Up  Contact information: [address] [place] [address] [phone]          Allergies  Allergen Reactions  . Wellbutrin [Bupropion] Other (See Comments)    Reaction is not known per spouse   Allergies as of 09/03/2019      Reactions   Wellbutrin [bupropion] Other (See Comments)   Reaction is not known per spouse      Medication List    STOP taking these medications   ALPRAZolam 0.5 MG tablet Commonly known as: XANAX   escitalopram 10 MG tablet Commonly known as: Lexapro     TAKE these medications   albuterol 108 (90 Base) MCG/ACT inhaler Commonly known as: VENTOLIN HFA Inhale 2 puffs into the lungs every 4 (four) hours.   amLODipine 5 MG tablet Commonly known as: NORVASC Take 1 tablet (5 mg total) by mouth daily.   doxycycline 100 MG tablet Commonly known as: VIBRA-TABS Take 1 tablet (100 mg total) by mouth every 12 (twelve) hours for 2 days.   FLUoxetine 10 MG capsule Commonly known as: PROZAC Take 1 capsule (10 mg total) by mouth daily. Start taking on: September 04, 2019   OLANZapine 2.5 MG tablet Commonly known as: ZYPREXA Take 1 tablet (2.5 mg total) by mouth at bedtime for 7 days.   Trelegy Ellipta 100-62.5-25 MCG/INH Aepb Generic drug: Fluticasone-Umeclidin-Vilant Take 1 puff by mouth daily.       Procedures/Studies: Ct Head Wo Contrast  Result Date: 08/31/2019 CLINICAL DATA:  Altered level of consciousness. EXAM: CT HEAD WITHOUT CONTRAST TECHNIQUE: Contiguous axial images were obtained from the base of the skull through the vertex without intravenous contrast. COMPARISON:  CT head without contrast 08/21/2019 FINDINGS: Brain: No acute infarct, hemorrhage, or mass lesion is present. No significant white matter lesions are present. The ventricles are of normal size. No significant extraaxial  fluid collection is present. Vascular: No hyperdense vessel or unexpected calcification. Skull: Calvarium is intact. No focal lytic or blastic lesions are present. Sinuses/Orbits: The paranasal sinuses and mastoid air cells are clear. The globes and orbits are within normal limits. IMPRESSION: Normal CT of the head. Electronically Signed   By: Marin Roberts M.D.   On: 08/31/2019 18:51   Ct Head Wo Contrast  Result Date: 08/21/2019 CLINICAL DATA:  Headache, acute, normal neuro exam. Additional history provided: Nausea and vomiting for past several days. EXAM: CT HEAD WITHOUT CONTRAST TECHNIQUE: Contiguous axial images were obtained from the base of the skull through the vertex without intravenous contrast. COMPARISON:  No pertinent prior studies available for comparison. FINDINGS: Brain: No evidence of acute intracranial hemorrhage. No demarcated cortical infarction. No evidence of intracranial mass. No midline shift or extra-axial fluid collection. Cerebral volume is normal for age. Vascular: No hyperdense vessel. Skull: Normal. Negative  for fracture or focal lesion. Sinuses/Orbits: Visualized orbits demonstrate no acute abnormality. Visualized paranasal sinuses are clear. Visualized mastoid air cells are clear. IMPRESSION: No evidence of acute intracranial abnormality. Electronically Signed   By: Kellie Simmering   On: 08/21/2019 15:05   Dg Chest Port 1 View  Result Date: 09/02/2019 CLINICAL DATA:  Community acquired pneumonia. EXAM: PORTABLE CHEST 1 VIEW COMPARISON:  Radiograph 09/01/2019, CT 11/23/2011 FINDINGS: Coarse interstitial and chronic emphysematous changes are present with stable biapical pleuroparenchymal scarring. Focal opacity is again seen in the left lung base. No pneumothorax or visible effusion. The cardiomediastinal contours are unremarkable. Degenerative changes are present in the and imaged spine and shoulders. IMPRESSION: 1. Focal opacity in the left lung base is suspicious for or  atelectasis. 2. Coarse interstitial and chronic emphysematous changes. Electronically Signed   By: Lovena Le M.D.   On: 09/02/2019 04:31   Dg Chest Port 1 View  Result Date: 09/01/2019 CLINICAL DATA:  Cough, COPD. EXAM: PORTABLE CHEST 1 VIEW COMPARISON:  08/31/2019 FINDINGS: Developing opacity at the left lung base since prior study. Mild interstitial prominence. With accentuated minor fissure in the right chest, not present the previous study. Cardiomediastinal contours are normal. No acute bone finding. IMPRESSION: Shifting opacities in the chest now with left lower lobe opacity, consider shifting atelectasis or developing left lower lobe consolidation. Interstitial prominence may reflect interstitial edema scarring is noted along the minor fissure in the right chest. Electronically Signed   By: Zetta Bills M.D.   On: 09/01/2019 08:17   Dg Chest Portable 1 View  Result Date: 08/31/2019 CLINICAL DATA:  Weakness, confusing combative EXAM: PORTABLE CHEST 1 VIEW COMPARISON:  Radiograph 08/21/2019 FINDINGS: Diffuse hazy interstitial opacity with basilar predominance. Vascular cephalization is noted. Mild more consolidative opacity is noted in the periphery of the right lung base. Cardiomediastinal contours are unremarkable for the portable technique. Low positioning of the left humeral head relative to the glenoid, possibly positional. No other acute osseous or soft tissue abnormality. IMPRESSION: 1. Diffuse hazy interstitial opacity with basilar predominance, consistent with interstitial edema. 2. More consolidative opacity in the periphery of the right lung base, could reflect alveolar edema or infection. 3. Low positioning of the left humeral head relative to the glenoid, correlate with shoulder symptoms. Consider dedicated radiographs. Electronically Signed   By: Lovena Le M.D.   On: 08/31/2019 23:47   Dg Chest Port 1 View  Result Date: 08/21/2019 CLINICAL DATA:  Nausea and vomiting, COPD  EXAM: PORTABLE CHEST 1 VIEW COMPARISON:  02/23/2015 FINDINGS: Cardiomegaly. Both lungs are clear. The visualized skeletal structures are unremarkable. IMPRESSION: Cardiomegaly without acute abnormality of the lungs on rotated AP portable examination. Electronically Signed   By: Eddie Candle M.D.   On: 08/21/2019 13:34     Subjective: Pt without complaints, wants to go home, says she is not going to [facility].    Discharge Exam: Vitals:   09/02/19 2315 09/03/19 0609  BP: (!) 155/98 126/82  Pulse: 100 83  Resp: 20 18  Temp: 98.8 F (37.1 C) 98.2 F (36.8 C)  SpO2: 95% 95%   Vitals:   09/02/19 1214 09/02/19 1508 09/02/19 2315 09/03/19 0609  BP:  (!) 160/102 (!) 155/98 126/82  Pulse:  (!) 108 100 83  Resp:  16 20 18   Temp:  98 F (36.7 C) 98.8 F (37.1 C) 98.2 F (36.8 C)  TempSrc:  Oral Oral Oral  SpO2: 95% 98% 95% 95%    General exam: Awake, no  apparent distress with pressured speech. Cooperative.  Respiratory system: Clear. No increased work of breathing. Cardiovascular system: S1 & S2 heard. No JVD, murmurs, gallops, clicks or pedal edema. Gastrointestinal system: Abdomen is nondistended, soft and nontender. Normal bowel sounds heard. Central nervous system: Alert and oriented. No focal neurological deficits. Extremities: no CCE.   The results of significant diagnostics from this hospitalization (including imaging, microbiology, ancillary and laboratory) are listed below for reference.     Microbiology: Recent Results (from the past 240 hour(s))  SARS Coronavirus 2 by RT PCR (hospital order, performed in [facility] hospital lab) Nasopharyngeal Nasopharyngeal Swab     Status: None   Collection Time: 09/01/19 12:05 AM   Specimen: Nasopharyngeal Swab  Result Value Ref Range Status   SARS Coronavirus 2 NEGATIVE NEGATIVE Final    Comment: (NOTE) If result is NEGATIVE SARS-CoV-2 target nucleic acids are NOT DETECTED. The SARS-CoV-2 RNA is generally detectable in upper and  lower  respiratory specimens during the acute phase of infection. The lowest  concentration of SARS-CoV-2 viral copies this assay can detect is 250  copies / mL. A negative result does not preclude SARS-CoV-2 infection  and should not be used as the sole basis for treatment or other  patient management decisions.  A negative result may occur with  improper specimen collection / handling, submission of specimen other  than nasopharyngeal swab, presence of viral mutation(s) within the  areas targeted by this assay, and inadequate number of viral copies  (<250 copies / mL). A negative result must be combined with clinical  observations, patient history, and epidemiological information. If result is POSITIVE SARS-CoV-2 target nucleic acids are DETECTED. The SARS-CoV-2 RNA is generally detectable in upper and lower  respiratory specimens dur ing the acute phase of infection.  Positive  results are indicative of active infection with SARS-CoV-2.  Clinical  correlation with patient history and other diagnostic information is  necessary to determine patient infection status.  Positive results do  not rule out bacterial infection or co-infection with other viruses. If result is PRESUMPTIVE POSTIVE SARS-CoV-2 nucleic acids MAY BE PRESENT.   A presumptive positive result was obtained on the submitted specimen  and confirmed on repeat testing.  While 2019 novel coronavirus  (SARS-CoV-2) nucleic acids may be present in the submitted sample  additional confirmatory testing may be necessary for epidemiological  and / or clinical management purposes  to differentiate between  SARS-CoV-2 and other Sarbecovirus currently known to infect humans.  If clinically indicated additional testing with an alternate test  methodology [phone]) is advised. The SARS-CoV-2 RNA is generally  detectable in upper and lower respiratory sp ecimens during the acute  phase of infection. The expected result is  Negative. Fact Sheet for Patients:  BoilerBrush.com.cy Fact Sheet for Healthcare Providers: https://pope.com/ This test is not yet approved or cleared by the [place] FDA and has been authorized for detection and/or diagnosis of SARS-CoV-2 by FDA under an Emergency Use Authorization (EUA).  This EUA will remain in effect (meaning this test can be used) for the duration of the COVID-19 declaration under Section 564(b)(1) of the Act, 21 U.S.C. section 360bbb-3(b)(1), unless the authorization is terminated or revoked sooner. Performed at [facility], [address], [place], [place] [address]      Labs: BNP (last 3 results) Recent Labs    08/21/19 1127 08/31/19 1756  BNP 36.0 31.0   Basic Metabolic Panel: Recent Labs  Lab 08/31/19 1755 09/01/19 0838  NA 137 145  K 3.6 3.8  CL 106 110  CO2 23 26  GLUCOSE 97 94  BUN 8 8  CREATININE 0.73 0.64  CALCIUM 8.3* 8.7*   Liver Function Tests: Recent Labs  Lab 08/31/19 1755  AST 7*  ALT 11  ALKPHOS 80  BILITOT 0.3  PROT 6.8  ALBUMIN 3.7   No results for input(s): LIPASE, AMYLASE in the last 168 hours. No results for input(s): AMMONIA in the last 168 hours. CBC: Recent Labs  Lab 08/31/19 1755  WBC 9.5  NEUTROABS 7.5  HGB 13.4  HCT 40.3  MCV 98.8  PLT 280   Cardiac Enzymes: No results for input(s): CKTOTAL, CKMB, CKMBINDEX, TROPONINI in the last 168 hours. BNP: Invalid input(s): POCBNP CBG: No results for input(s): GLUCAP in the last 168 hours. D-Dimer No results for input(s): DDIMER in the last 72 hours. Hgb A1c No results for input(s): HGBA1C in the last 72 hours. Lipid Profile No results for input(s): CHOL, HDL, LDLCALC, TRIG, CHOLHDL, LDLDIRECT in the last 72 hours. Thyroid function studies No results for input(s): TSH, T4TOTAL, T3FREE, THYROIDAB in the last 72 hours.  Invalid input(s): FREET3 Anemia work up No results for input(s): VITAMINB12,  FOLATE, FERRITIN, TIBC, IRON, RETICCTPCT in the last 72 hours. Urinalysis    Component Value Date/Time   COLORURINE COLORLESS (A) 08/31/2019 1737   APPEARANCEUR CLEAR 08/31/2019 1737   LABSPEC 1.002 (L) 08/31/2019 1737   PHURINE 7.0 08/31/2019 1737   GLUCOSEU NEGATIVE 08/31/2019 1737   HGBUR NEGATIVE 08/31/2019 1737   BILIRUBINUR NEGATIVE 08/31/2019 1737   KETONESUR NEGATIVE 08/31/2019 1737   PROTEINUR NEGATIVE 08/31/2019 1737   NITRITE NEGATIVE 08/31/2019 1737   LEUKOCYTESUR NEGATIVE 08/31/2019 1737   Sepsis Labs Invalid input(s): PROCALCITONIN,  WBC,  LACTICIDVEN Microbiology Recent Results (from the past 240 hour(s))  SARS Coronavirus 2 by RT PCR (hospital order, performed in [facility] hospital lab) Nasopharyngeal Nasopharyngeal Swab     Status: None   Collection Time: 09/01/19 12:05 AM   Specimen: Nasopharyngeal Swab  Result Value Ref Range Status   SARS Coronavirus 2 NEGATIVE NEGATIVE Final    Comment: (NOTE) If result is NEGATIVE SARS-CoV-2 target nucleic acids are NOT DETECTED. The SARS-CoV-2 RNA is generally detectable in upper and lower  respiratory specimens during the acute phase of infection. The lowest  concentration of SARS-CoV-2 viral copies this assay can detect is 250  copies / mL. A negative result does not preclude SARS-CoV-2 infection  and should not be used as the sole basis for treatment or other  patient management decisions.  A negative result may occur with  improper specimen collection / handling, submission of specimen other  than nasopharyngeal swab, presence of viral mutation(s) within the  areas targeted by this assay, and inadequate number of viral copies  (<250 copies / mL). A negative result must be combined with clinical  observations, patient history, and epidemiological information. If result is POSITIVE SARS-CoV-2 target nucleic acids are DETECTED. The SARS-CoV-2 RNA is generally detectable in upper and lower  respiratory specimens  dur ing the acute phase of infection.  Positive  results are indicative of active infection with SARS-CoV-2.  Clinical  correlation with patient history and other diagnostic information is  necessary to determine patient infection status.  Positive results do  not rule out bacterial infection or co-infection with other viruses. If result is PRESUMPTIVE POSTIVE SARS-CoV-2 nucleic acids MAY BE PRESENT.   A presumptive positive result was obtained on the submitted specimen  and confirmed on  repeat testing.  While 2019 novel coronavirus  (SARS-CoV-2) nucleic acids may be present in the submitted sample  additional confirmatory testing may be necessary for epidemiological  and / or clinical management purposes  to differentiate between  SARS-CoV-2 and other Sarbecovirus currently known to infect humans.  If clinically indicated additional testing with an alternate test  methodology [phone]) is advised. The SARS-CoV-2 RNA is generally  detectable in upper and lower respiratory sp ecimens during the acute  phase of infection. The expected result is Negative. Fact Sheet for Patients:  BoilerBrush.com.cy Fact Sheet for Healthcare Providers: https://pope.com/ This test is not yet approved or cleared by the [place] FDA and has been authorized for detection and/or diagnosis of SARS-CoV-2 by FDA under an Emergency Use Authorization (EUA).  This EUA will remain in effect (meaning this test can be used) for the duration of the COVID-19 declaration under Section 564(b)(1) of the Act, 21 U.S.C. section 360bbb-3(b)(1), unless the authorization is terminated or revoked sooner. Performed at [facility], [address], [place], [place] [address]     Time coordinating discharge:   SIGNED:  Standley Dakins, MD  [facility] 09/03/2019, 12:39 PM How to contact the [facility] Attending or Consulting provider 7A - 7P or covering provider during  after hours 7P -7A, for this patient?  1. Check the care team in [facility] and look for a) attending/consulting [facility] provider listed and b) the [facility] team listed 2. Log into [email or web address] and use [facility]'s universal password to access. If you do not have the password, please contact the hospital operator. 3. Locate the [facility] provider you are looking for under [facility] and page to a number that you can be directly reached. 4. If you still have difficulty reaching the provider, please page the [facility] (Director on Call) for the Hospitalists listed on amion for assistance.

## 2019-09-03 NOTE — Progress Notes (Signed)
Nsg Discharge Note  Admit Date:  08/31/2019 Discharge date: 09/03/2019   Georganna Skeans to be D/C'd Home per MD order.  AVS completed.   Patient able to verbalize understanding.  Discharge Medication: Allergies as of 09/03/2019      Reactions   Wellbutrin [bupropion] Other (See Comments)   Reaction is not known per spouse      Medication List    STOP taking these medications   ALPRAZolam 0.5 MG tablet Commonly known as: XANAX   escitalopram 10 MG tablet Commonly known as: Lexapro     TAKE these medications   albuterol 108 (90 Base) MCG/ACT inhaler Commonly known as: VENTOLIN HFA Inhale 2 puffs into the lungs every 4 (four) hours.   amLODipine 5 MG tablet Commonly known as: NORVASC Take 1 tablet (5 mg total) by mouth daily.   doxycycline 100 MG tablet Commonly known as: VIBRA-TABS Take 1 tablet (100 mg total) by mouth every 12 (twelve) hours for 2 days.   FLUoxetine 10 MG capsule Commonly known as: PROZAC Take 1 capsule (10 mg total) by mouth daily. Start taking on: September 04, 2019   OLANZapine 2.5 MG tablet Commonly known as: ZYPREXA Take 1 tablet (2.5 mg total) by mouth at bedtime for 7 days.   Trelegy Ellipta 100-62.5-25 MCG/INH Aepb Generic drug: Fluticasone-Umeclidin-Vilant Take 1 puff by mouth daily.       Discharge Assessment: Vitals:   09/03/19 0609 09/03/19 1337  BP: 126/82 114/65  Pulse: 83 80  Resp: 18 19  Temp: 98.2 F (36.8 C) 98.4 F (36.9 C)  SpO2: 95% 97%   Skin clean, dry and intact without evidence of skin break down, no evidence of skin tears noted. IV catheter discontinued intact. Site without signs and symptoms of complications - no redness or edema noted at insertion site, patient denies c/o pain - only slight tenderness at site.  Dressing with slight pressure applied.  D/c Instructions-Education: Discharge instructions given to patient/family with verbalized understanding. D/c education completed with patient including  follow up instructions, medication list, d/c activities limitations if indicated, with other d/c instructions as indicated by MD - patient able to verbalize understanding, all questions fully answered. Patient instructed to return to ED, call 911, or call MD for any changes in condition.  Patient escorted via [place], and D/C home via private auto.  Joshue Badal Loletha Grayer, RN 09/03/2019 4:07 PM

## 2019-09-03 NOTE — Discharge Instructions (Signed)
PLEASE TAKE THE OLANZAPINE EVERY NIGHT FOR 7 DAYS.  PLEASE DC LEXAPRO AND TAKE PROZAC DAILY.  PLEASE FOLLOW UP WITH PSYCHIATRY AS RECOMMENDED.     IMPORTANT INFORMATION: PAY CLOSE ATTENTION   PHYSICIAN DISCHARGE INSTRUCTIONS  Follow with Primary care provider  Alfonso Ellis, NP  and other consultants as instructed by your Hospitalist Physician  [place] IF SYMPTOMS COME BACK, WORSEN OR NEW PROBLEM DEVELOPS   Please note: You were cared for by a hospitalist during your hospital stay. Every effort will be made to forward records to your primary care provider.  You can request that your primary care provider send for your hospital records if they have not received them.  Once you are discharged, your primary care physician will handle any further medical issues. Please note that NO REFILLS for any discharge medications will be authorized once you are discharged, as it is imperative that you return to your primary care physician (or establish a relationship with a primary care physician if you do not have one) for your post hospital discharge needs so that they can reassess your need for medications and monitor your lab values.  Please get a complete blood count and chemistry panel checked by your Primary MD at your next visit, and again as instructed by your Primary MD.  Get Medicines reviewed and adjusted: Please take all your medications with you for your next visit with your Primary MD  Laboratory/radiological data: Please request your Primary MD to go over all hospital tests and procedure/radiological results at the follow up, please ask your primary care provider to get all Hospital records sent to his/her office.  In some cases, they will be blood work, cultures and biopsy results pending at the time of your discharge. Please request that your primary care provider follow up on these results.  If you are diabetic, please bring your blood sugar readings  with you to your follow up appointment with primary care.    Please call and make your follow up appointments as soon as possible.    Also Note the following: If you experience worsening of your admission symptoms, develop shortness of breath, life threatening emergency, suicidal or homicidal thoughts you must seek medical attention immediately by calling 911 or calling your MD immediately  if symptoms less severe.  You must read complete instructions/literature along with all the possible adverse reactions/side effects for all the Medicines you take and that have been prescribed to you. Take any new Medicines after you have completely understood and accpet all the possible adverse reactions/side effects.   Do not drive when taking Pain medications or sleeping medications (Benzodiazepines)  Do not take more than prescribed Pain, Sleep and Anxiety Medications. It is not advisable to combine anxiety,sleep and pain medications without talking with your primary care practitioner  Special Instructions: If you have smoked or chewed Tobacco  in the last 2 yrs please stop smoking, stop any regular Alcohol  and or any Recreational drug use.  Wear Seat belts while driving.  Do not drive if taking any narcotic, mind altering or controlled substances or recreational drugs or alcohol.

## 2019-09-03 NOTE — Consult Note (Signed)
Telepsych Consultation   Reason for Consult:  mania Referring Physician:  Dr. Vernell Leep Location of Patient: [facility] Location of Provider: Behavioral Health TTS Department  Patient Identification: Rebecca Liu MRN:  [id number] Principal Diagnosis: CAP (community acquired pneumonia) Diagnosis:  Principal Problem:   CAP (community acquired pneumonia) Active Problems:   Acute Mania   Total Time spent with patient: 30 minutes  Subjective:   Rebecca Liu is a 53 y.o. female patient admitted with mania.  Patient seen and reassessed. Patient was started on zyprexa and prozac yesterday and appears to be much improved as evident by her ability to communicate and improved thought processes. Her speech is intact, and logical thought process. She is alert and oriented, she requires minimal redirection. She also shows improved insight and judgment as she is able to verbalize the importance of taking her medication, and limiting her water intake due to electrolyte imbalances and osmolarity. At this time she denies any si/hi/avh.   HPI:  Rebecca Liu is an 53 y.o. female presenting under IVC for mania. When asked why are you here, patient stated "because I had loss of memory and I was talking to myself". Per triage note, patient boyfriend reported patient was admitted here recently for low sodium and potassium. Boyfriend stated "she has lost her memory since she left here and we took her to her doctor today and he said to get to the hospital right away". Patient was confused, hollering and aggressive in triage. Patient denied receiving any mental health outpatient services at this time. Patient denied prior suicide attempts and self-harming behaviors. Patient currently resides alone and has a boyfriend [name])  that sometimes lives with her, whom is her main support. Patient reported normal sleep and appetite. Patient denied SI, HI, psychosis and alcohol/drug usage.  During the evaluation she was  alert and oriented x three, intermittent periods of confusion and tangentiality. She is pleasantly engaged and cooperative, however her speech is broken and pressured. She denies SI/HI/avH.    Past Psychiatric History: Depression. Does not see a psychiatrist however sees PCP for medications Edwina Barth, MD. She was previously taking Lexapro  for up to 10 years. THe medication stopped working for her, and then was prescribed Effexor  po daily, which was increased to Effexor  po daily. This medication was decreased due to complications which patient nor husband are able to remember. They did state the medication was too strong.  No previous suicide attempts. Reported suicidal ideations when previously on Wellbutrin. Denies any previous mania or psychosis.   Risk to Self: Suicidal Ideation: No Suicidal Intent: No Is patient at risk for suicide?: No Suicidal Plan?: No Access to Means: No What has been your use of drugs/alcohol within the last 12 months?: (none reported) How many times?: (0) Other Self Harm Risks: (none reported) Triggers for Past Attempts: (n/a) Intentional Self Injurious Behavior: None Risk to Others: Homicidal Ideation: No Thoughts of Harm to Others: No Current Homicidal Intent: No Current Homicidal Plan: No Access to Homicidal Means: No Identified Victim: (n/a) History of harm to others?: No Assessment of Violence: None Noted Violent Behavior Description: (none reported) Does patient have access to weapons?: No Criminal Charges Pending?: No Does patient have a court date: No Prior Inpatient Therapy: Prior Inpatient Therapy: No Prior Outpatient Therapy: Prior Outpatient Therapy: No Does patient have an ACCT team?: No Does patient have Intensive In-House Services?  : No Does patient have [facility] services? : No Does patient have P4CC services?: No  Past Medical History:  Past Medical History:  Diagnosis Date  . COPD (chronic obstructive pulmonary disease)  (HCC)   . Depression    History reviewed. No pertinent surgical history. Family History: History reviewed. No pertinent family history. Family Psychiatric  History: Denies Social History:  Social History   Substance and Sexual Activity  Alcohol Use No     Social History   Substance and Sexual Activity  Drug Use No    Social History   Socioeconomic History  . Marital status: Single    Spouse name: Not on file  . Number of children: Not on file  . Years of education: Not on file  . Highest education level: Not on file  Occupational History  . Not on file  Social Needs  . Financial resource strain: Not on file  . Food insecurity    Worry: Not on file    Inability: Not on file  . Transportation needs    Medical: Not on file    Non-medical: Not on file  Tobacco Use  . Smoking status: Current Every Day Smoker  . Smokeless tobacco: Never Used  Substance and Sexual Activity  . Alcohol use: No  . Drug use: No  . Sexual activity: Not on file  Lifestyle  . Physical activity    Days per week: Not on file    Minutes per session: Not on file  . Stress: Not on file  Relationships  . Social Musician on phone: Not on file    Gets together: Not on file    Attends religious service: Not on file    Active member of club or organization: Not on file    Attends meetings of clubs or organizations: Not on file    Relationship status: Not on file  Other Topics Concern  . Not on file  Social History Narrative  . Not on file   Additional Social History:    Allergies:   Allergies  Allergen Reactions  . Wellbutrin [Bupropion] Other (See Comments)    Reaction is not known per spouse    Labs:  No results found for this or any previous visit (from the past 48 hour(s)).  Medications:  Current Facility-Administered Medications  Medication Dose Route Frequency Provider Last Rate Last Dose  . 0.9 %  sodium chloride infusion   Intravenous Continuous Laural Benes, Clanford  L, MD 10 mL/hr at 09/02/19 0528    . acetaminophen (TYLENOL) tablet 650 mg  650 mg Oral Q6H PRN Meredeth Ide, MD   650 mg at 09/01/19 1537   Or  . acetaminophen (TYLENOL) suppository 650 mg  650 mg Rectal Q6H PRN Meredeth Ide, MD      . doxycycline (VIBRA-TABS) tablet 100 mg  100 mg Oral Q12H Johnson, Clanford L, MD   100 mg at 09/03/19 0950  . enoxaparin (LOVENOX) injection 40 mg  40 mg Subcutaneous Q24H Meredeth Ide, MD   40 mg at 09/03/19 0950  . FLUoxetine (PROZAC) capsule 10 mg  10 mg Oral Daily Maryagnes Amos, FNP   10 mg at 09/03/19 0950  . fluticasone furoate-vilanterol (BREO ELLIPTA) 100-25 MCG/INH 1 puff  1 puff Inhalation Daily Meredeth Ide, MD   1 puff at 09/03/19 0813   And  . umeclidinium bromide (INCRUSE ELLIPTA) 62.5 MCG/INH 1 puff  1 puff Inhalation Daily Meredeth Ide, MD   1 puff at 09/03/19 0813  . haloperidol lactate (HALDOL) injection 5 mg  5  mg Intravenous Q6H PRN Laural BenesJohnson, Clanford L, MD   5 mg at 09/01/19 2146  . labetalol (NORMODYNE) injection 10 mg  10 mg Intravenous Q2H PRN Johnson, Clanford L, MD   10 mg at 09/01/19 1550  . OLANZapine (ZYPREXA) tablet 2.5 mg  2.5 mg Oral QHS Maryagnes AmosStarkes-Perry, Aariona Momon S, FNP   2.5 mg at 09/02/19 2200  . ondansetron (ZOFRAN) tablet 4 mg  4 mg Oral Q6H PRN Meredeth IdeLama, Gagan S, MD       Or  . ondansetron (ZOFRAN) injection 4 mg  4 mg Intravenous Q6H PRN Meredeth IdeLama, Gagan S, MD        Musculoskeletal: Strength & Muscle Tone: within normal limits Gait & Station: normal Patient leans: N/A  Psychiatric Specialty Exam: Physical Exam   ROS   Blood pressure 126/82, pulse 83, temperature 98.2 F (36.8 C), temperature source Oral, resp. rate 18, SpO2 95 %.There is no height or weight on file to calculate BMI.  General Appearance: Disheveled  Eye Contact:  Minimal  Speech:  Pressured  Volume:  Normal  Mood:  Anxious, Euphoric and Irritable  Affect:  Blunt and Full Range  Thought Process:  Irrelevant, Linear and Descriptions of  Associations: Loose  Orientation:  Full (Time, Place, and Person)  Thought Content:  Logical and Tangential  Suicidal Thoughts:  No  Homicidal Thoughts:  No  Memory:  Immediate;   Fair Recent;   Fair  Judgement:  Impaired  Insight:  Shallow  Psychomotor Activity:  Increased and Restlessness  Concentration:  Concentration: Poor and Attention Span: Poor  Recall:  Poor  Fund of Knowledge:  Fair  Language:  Fair  Akathisia:  Negative  Handed:  Right  AIMS (if indicated):     Assets:  Communication Skills Desire for Improvement Financial Resources/Insurance Housing Leisure Time Physical Health Social Support  ADL's:  Impaired  Cognition:  WNL  Sleep:        Treatment Plan Summary: Daily contact with patient to assess and evaluate symptoms and progress in treatment, Medication management and Plan Recommend inpatient. Patient will benefit from short term use of antipsychotic for resolution of mania. mania at this time is unclear and could be due to ongoing electrolyte abnormality, discontinuation syndrome, serotonin syndrome, and or SNRI use. patient reports allergy to Wellbutin (NDRI) which made her confused and suicidal. Will start one time dose of zydis 5mg  po in a songle dose. Will start olanzapine 2.5mg  po qhs. Will start prozac 10mg  po daily for depression, patient and husband reports taking Lexapro for 10 years with improvement in symptoms up until recently. They both report lexapro was no longer working and was switched to Effexor. Will discontinue lexapro and start prozac at this time. 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 baseline labs have been reviewed and assessed. W  Disposition: No evidence of imminent risk to self or others at present.   Patient does not meet criteria for psychiatric inpatient admission. Supportive therapy provided about ongoing stressors. Discussed crisis plan, support from social network, calling 911, coming to the  Emergency Department, and calling Suicide Hotline. Discharge with rx of zyprexa 2.5mg  po qhs for 7 days. cWill also need rx for prozac 10mg  po daily . she is advised to follow up with pscyh to receive ongoing medication management. at this time she is psych cleared.   This service was provided via telemedicine using a 2-way, interactive audio and video technology.  Names of all persons participating in this telemedicine  service and their role in this encounter. Name: Sheran Fava Role: FNP-BC  Name: Rebecca Liu Role: Patient                                                                Suella Broad, FNP 09/03/2019 10:39 AM

## 2019-09-03 NOTE — Progress Notes (Signed)
PROGRESS NOTE    Rebecca Liu  EXB:[id number]  DOB: [birth year]  DOA: 08/31/2019 PCP: Ardell IsaacsHill, Stacy, NP   Brief Admission Hx: 53 year old female presented with an acute manic episode.  She has a history of COPD and hypertension.  She was admitted for medical clearance as she was noted to have a possible pneumonia.  MDM/Assessment & Plan:   1. Question of pneumonia- I am not convinced that she actually has a pneumonia at this time with a normal white blood cell count, she is afebrile and not requiring oxygen.  She has been rapidly de-escalated to doxycycline and should finish the course tomorrow of oral doxy.  Her chest x-ray suggesting atelectasis and I have added incentive spirometry.  She is medically cleared to transfer to behavioral health.   2. Acute manic episode-patient remains in acute mania.  She was evaluated by TTS and has been started on zyprexa and prozac.  3. Essential hypertension- IV blood pressure medication ordered as needed.  Start amlodipine 5 mg daily. 4. COPD-stable on home bronchodilators. 5. 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  DVT prophylaxis: Lovenox Code Status: Full Family Communication: Attempted to contact husband no answer Disposition Plan: PT MEDICALLY CLEARED TO DISCHARGE TO BEHAVIORAL HEALTH   Consultants:  TTS behavioral health  Procedures:    Antimicrobials:  Doxycycline  Subjective: Patient remains in a manic state she is speaking constantly in a pressured manner and loose disorganized thoughts remain unchanged, she denies SOB and chest pain and reports that she is going home and not going to behavioral health.   Objective: Vitals:   09/02/19 1214 09/02/19 1508 09/02/19 2315 09/03/19 0609  BP:  (!) 160/102 (!) 155/98 126/82  Pulse:  (!) 108 100 83  Resp:  16 20 18   Temp:  98 F (36.7 C) 98.8 F (37.1 C) 98.2 F (36.8 C)   TempSrc:  Oral Oral Oral  SpO2: 95% 98% 95% 95%    Intake/Output Summary (Last 24 hours) at 09/03/2019 1056 Last data filed at 09/03/2019 0518 Gross per 24 hour  Intake 505 ml  Output -  Net 505 ml   There were no vitals filed for this visit.   REVIEW OF SYSTEMS  As per history otherwise all reviewed and reported negative  Exam:  General exam: Awake, no apparent distress with acute mania. Respiratory system: Clear. No increased work of breathing. Cardiovascular system: S1 & S2 heard. No JVD, murmurs, gallops, clicks or pedal edema. Gastrointestinal system: Abdomen is nondistended, soft and nontender. Normal bowel sounds heard. Central nervous system: Alert and oriented. No focal neurological deficits. Extremities: no CCE.  Data Reviewed: Basic Metabolic Panel: Recent Labs  Lab 08/31/19 1755 09/01/19 0838  NA 137 145  K 3.6 3.8  CL 106 110  CO2 23 26  GLUCOSE 97 94  BUN 8 8  CREATININE 0.73 0.64  CALCIUM 8.3* 8.7*   Liver Function Tests: Recent Labs  Lab 08/31/19 1755  AST 7*  ALT 11  ALKPHOS 80  BILITOT 0.3  PROT 6.8  ALBUMIN 3.7   No results for input(s): LIPASE, AMYLASE in the last 168 hours. No results for input(s): AMMONIA in the last 168 hours. CBC: Recent Labs  Lab 08/31/19 1755  WBC 9.5  NEUTROABS 7.5  HGB 13.4  HCT 40.3  MCV 98.8  PLT 280   Cardiac Enzymes: No results for input(s): CKTOTAL, CKMB, CKMBINDEX, TROPONINI in  the last 168 hours. CBG (last 3)  No results for input(s): GLUCAP in the last 72 hours. Recent Results (from the past 240 hour(s))  SARS Coronavirus 2 by RT PCR (hospital order, performed in [facility] hospital lab) Nasopharyngeal Nasopharyngeal Swab     Status: None   Collection Time: 09/01/19 12:05 AM   Specimen: Nasopharyngeal Swab  Result Value Ref Range Status   SARS Coronavirus 2 NEGATIVE NEGATIVE Final    Comment: (NOTE) If result is NEGATIVE SARS-CoV-2 target nucleic acids are NOT DETECTED. The SARS-CoV-2  RNA is generally detectable in upper and lower  respiratory specimens during the acute phase of infection. The lowest  concentration of SARS-CoV-2 viral copies this assay can detect is 250  copies / mL. A negative result does not preclude SARS-CoV-2 infection  and should not be used as the sole basis for treatment or other  patient management decisions.  A negative result may occur with  improper specimen collection / handling, submission of specimen other  than nasopharyngeal swab, presence of viral mutation(s) within the  areas targeted by this assay, and inadequate number of viral copies  (<250 copies / mL). A negative result must be combined with clinical  observations, patient history, and epidemiological information. If result is POSITIVE SARS-CoV-2 target nucleic acids are DETECTED. The SARS-CoV-2 RNA is generally detectable in upper and lower  respiratory specimens dur ing the acute phase of infection.  Positive  results are indicative of active infection with SARS-CoV-2.  Clinical  correlation with patient history and other diagnostic information is  necessary to determine patient infection status.  Positive results do  not rule out bacterial infection or co-infection with other viruses. If result is PRESUMPTIVE POSTIVE SARS-CoV-2 nucleic acids MAY BE PRESENT.   A presumptive positive result was obtained on the submitted specimen  and confirmed on repeat testing.  While 2019 novel coronavirus  (SARS-CoV-2) nucleic acids may be present in the submitted sample  additional confirmatory testing may be necessary for epidemiological  and / or clinical management purposes  to differentiate between  SARS-CoV-2 and other Sarbecovirus currently known to infect humans.  If clinically indicated additional testing with an alternate test  methodology [phone]) is advised. The SARS-CoV-2 RNA is generally  detectable in upper and lower respiratory sp ecimens during the acute  phase of  infection. The expected result is Negative. Fact Sheet for Patients:  StrictlyIdeas.no Fact Sheet for Healthcare Providers: BankingDealers.co.za This test is not yet approved or cleared by the [place] FDA and has been authorized for detection and/or diagnosis of SARS-CoV-2 by FDA under an Emergency Use Authorization (EUA).  This EUA will remain in effect (meaning this test can be used) for the duration of the COVID-19 declaration under Section 564(b)(1) of the Act, 21 U.S.C. section 360bbb-3(b)(1), unless the authorization is terminated or revoked sooner. Performed at [facility], [address] [place], [place] [address]      Studies: Dg Chest [facility] 1 View  Result Date: 09/02/2019 CLINICAL DATA:  Community acquired pneumonia. EXAM: PORTABLE CHEST 1 VIEW COMPARISON:  Radiograph 09/01/2019, CT 11/23/2011 FINDINGS: Coarse interstitial and chronic emphysematous changes are present with stable biapical pleuroparenchymal scarring. Focal opacity is again seen in the left lung base. No pneumothorax or visible effusion. The cardiomediastinal contours are unremarkable. Degenerative changes are present in the and imaged spine and shoulders. IMPRESSION: 1. Focal opacity in the left lung base is suspicious for or atelectasis. 2. Coarse interstitial and chronic emphysematous changes. Electronically Signed  By: Kreg Shropshire M.D.   On: 09/02/2019 04:31     Scheduled Meds: . doxycycline  100 mg Oral Q12H  . enoxaparin (LOVENOX) injection  40 mg Subcutaneous Q24H  . FLUoxetine  10 mg Oral Daily  . fluticasone furoate-vilanterol  1 puff Inhalation Daily   And  . umeclidinium bromide  1 puff Inhalation Daily  . OLANZapine  2.5 mg Oral QHS   Continuous Infusions: . sodium chloride 10 mL/hr at 09/02/19 7628    Principal Problem:   CAP (community acquired pneumonia) Active Problems:   Acute Mania  Time spent:   Standley Dakins, MD [facility] 09/03/2019, 10:56 AM    LOS: 2 days  How to contact the [facility] Attending or Consulting provider 7A - 7P or covering provider during after hours 7P -7A, for this patient?  1. Check the care team in [facility] and look for a) attending/consulting TR[facility] provider listed and b) the [facility] team listed 2. Log into [email or web address] and use [facility]'s universal password to access. If you do not have the password, please contact the hospital operator. 3. Locate the [facility] provider you are looking for under [facility] and page to a number that you can be directly reached. 4. If you still have difficulty reaching the provider, please page the [facility] (Director on Call) for the Hospitalists listed on amion for assistance.

## 2019-11-08 IMAGING — CR DG CHEST 1V PORT
1 series · 1 of 1 positions shown · non-contrast
Comparison: Radiograph 08/21/2019

CLINICAL DATA: Weakness, confusing combative

EXAM:
PORTABLE CHEST 1 VIEW

[portable]
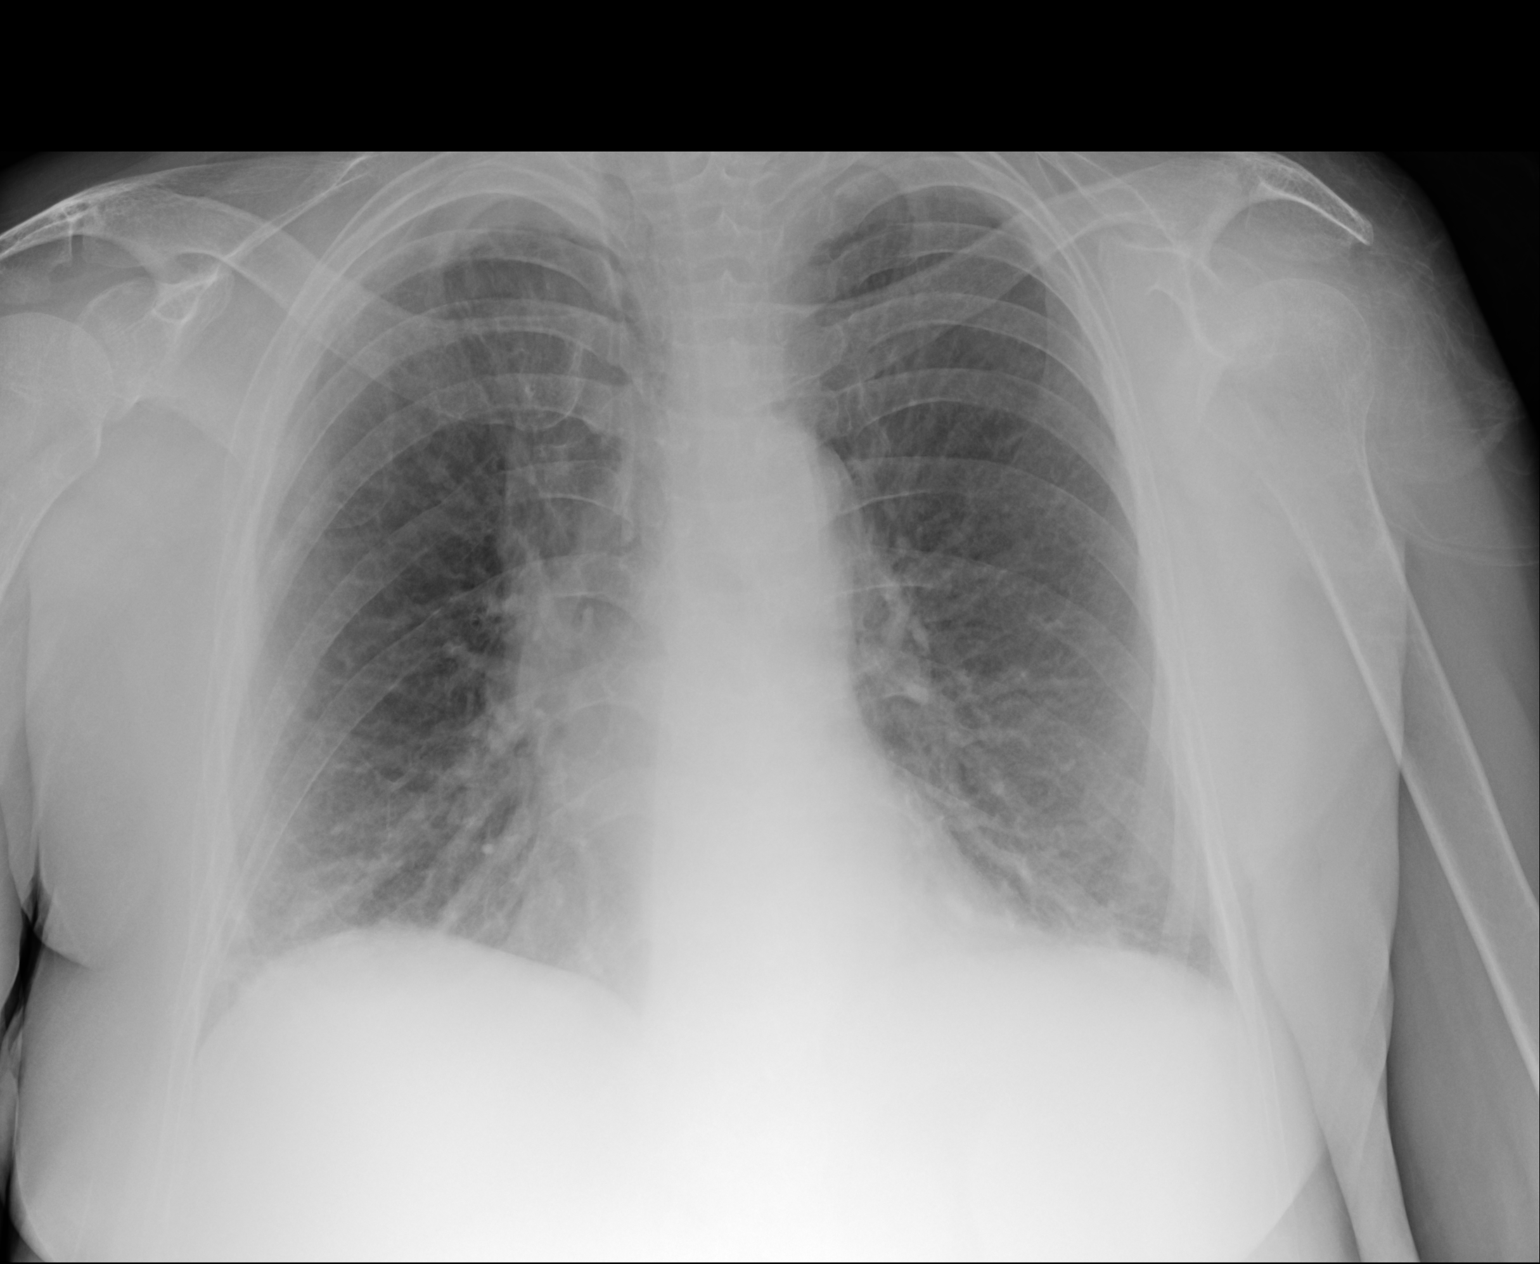

[1 of 1 positions shown; findings below may reference images not displayed]

FINDINGS: Diffuse hazy interstitial opacity with basilar predominance.
Vascular cephalization is noted. Mild more consolidative opacity is
noted in the periphery of the right lung base. Cardiomediastinal
contours are unremarkable for the portable technique. Low
positioning of the left humeral head relative to the glenoid,
possibly positional. No other acute osseous or soft tissue
abnormality.
IMPRESSION: 1. Diffuse hazy interstitial opacity with basilar predominance,
consistent with interstitial edema.
2. More consolidative opacity in the periphery of the right lung
base, could reflect alveolar edema or infection.
3. Low positioning of the left humeral head relative to the glenoid,
correlate with shoulder symptoms. Consider dedicated radiographs.
